# Patient Record
Sex: Male | Born: 1965
Health system: Southern US, Community
[De-identification: ages and names within clinical notes are randomized; demographics above are authoritative.]

## PROBLEM LIST (undated history)

## (undated) DIAGNOSIS — G4730 Sleep apnea, unspecified: Secondary | ICD-10-CM

## (undated) DIAGNOSIS — I10 Essential (primary) hypertension: Secondary | ICD-10-CM

## (undated) DIAGNOSIS — M109 Gout, unspecified: Secondary | ICD-10-CM

## (undated) DIAGNOSIS — K819 Cholecystitis, unspecified: Secondary | ICD-10-CM

## (undated) DIAGNOSIS — I509 Heart failure, unspecified: Secondary | ICD-10-CM

## (undated) DIAGNOSIS — K8020 Calculus of gallbladder without cholecystitis without obstruction: Secondary | ICD-10-CM

## (undated) DIAGNOSIS — R51 Headache: Secondary | ICD-10-CM

## (undated) DIAGNOSIS — J302 Other seasonal allergic rhinitis: Secondary | ICD-10-CM

## (undated) DIAGNOSIS — Z87442 Personal history of urinary calculi: Secondary | ICD-10-CM

## (undated) DIAGNOSIS — K5790 Diverticulosis of intestine, part unspecified, without perforation or abscess without bleeding: Secondary | ICD-10-CM

## (undated) DIAGNOSIS — R519 Headache, unspecified: Secondary | ICD-10-CM

## (undated) DIAGNOSIS — K219 Gastro-esophageal reflux disease without esophagitis: Secondary | ICD-10-CM

## (undated) DIAGNOSIS — T7840XA Allergy, unspecified, initial encounter: Secondary | ICD-10-CM

## (undated) HISTORY — DX: Calculus of gallbladder without cholecystitis without obstruction: K80.20

## (undated) HISTORY — DX: Gout, unspecified: M10.9

## (undated) HISTORY — PX: TONSILLECTOMY AND ADENOIDECTOMY: SUR1326

## (undated) HISTORY — DX: Cholecystitis, unspecified: K81.9

## (undated) HISTORY — DX: Diverticulosis of intestine, part unspecified, without perforation or abscess without bleeding: K57.90

## (undated) HISTORY — DX: Other seasonal allergic rhinitis: J30.2

## (undated) HISTORY — DX: Heart failure, unspecified: I50.9

## (undated) HISTORY — DX: Gastro-esophageal reflux disease without esophagitis: K21.9

## (undated) HISTORY — DX: Allergy, unspecified, initial encounter: T78.40XA

---
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
## 1999-06-24 ENCOUNTER — Emergency Department (HOSPITAL_COMMUNITY): Admission: EM | Admit: 1999-06-24 | Discharge: 1999-06-24 | Payer: Self-pay | Admitting: Emergency Medicine

## 2001-05-04 ENCOUNTER — Encounter: Admission: RE | Admit: 2001-05-04 | Discharge: 2001-08-02 | Payer: Self-pay | Admitting: Neurology

## 2001-05-31 ENCOUNTER — Ambulatory Visit (HOSPITAL_BASED_OUTPATIENT_CLINIC_OR_DEPARTMENT_OTHER): Admission: RE | Admit: 2001-05-31 | Discharge: 2001-05-31 | Payer: Self-pay | Admitting: Internal Medicine

## 2005-11-02 ENCOUNTER — Ambulatory Visit: Payer: Self-pay | Admitting: Internal Medicine

## 2013-09-26 ENCOUNTER — Other Ambulatory Visit: Payer: Self-pay | Admitting: Internal Medicine

## 2013-09-26 DIAGNOSIS — G43909 Migraine, unspecified, not intractable, without status migrainosus: Secondary | ICD-10-CM

## 2013-10-03 ENCOUNTER — Ambulatory Visit
Admission: RE | Admit: 2013-10-03 | Discharge: 2013-10-03 | Disposition: A | Discharge status: Home / Self Care | Payer: BC Managed Care – PPO | Source: Ambulatory Visit | Attending: Internal Medicine | Admitting: Internal Medicine

## 2013-10-03 DIAGNOSIS — G43909 Migraine, unspecified, not intractable, without status migrainosus: Secondary | ICD-10-CM

## 2013-10-03 MED ORDER — GADOBENATE DIMEGLUMINE 529 MG/ML IV SOLN
15.0000 mL | Freq: Once | INTRAVENOUS | Status: AC | PRN
  Administered 2013-10-03: 15 mL via INTRAVENOUS

## 2016-03-16 DIAGNOSIS — Z6828 Body mass index (BMI) 28.0-28.9, adult: Secondary | ICD-10-CM | POA: Diagnosis not present

## 2016-03-16 DIAGNOSIS — L03031 Cellulitis of right toe: Secondary | ICD-10-CM | POA: Diagnosis not present

## 2016-03-17 ENCOUNTER — Telehealth: Payer: Self-pay | Admitting: Podiatry

## 2016-03-17 NOTE — Telephone Encounter (Signed)
Spoke to Dr Raul Del assistant and she spoke to pt and pt feels appt not needed, toe is getting better per pt.Referral was shredded.

## 2016-03-17 NOTE — Telephone Encounter (Signed)
lvm for pt to call back to schedule an appt for this week with Dr Milinda Pointer per Dr Marton Redwood.

## 2016-03-18 ENCOUNTER — Encounter: Payer: Self-pay | Admitting: Podiatry

## 2016-03-18 ENCOUNTER — Ambulatory Visit (INDEPENDENT_AMBULATORY_CARE_PROVIDER_SITE_OTHER): Payer: BLUE CROSS/BLUE SHIELD

## 2016-03-18 ENCOUNTER — Ambulatory Visit (INDEPENDENT_AMBULATORY_CARE_PROVIDER_SITE_OTHER): Payer: BLUE CROSS/BLUE SHIELD | Admitting: Podiatry

## 2016-03-18 VITALS — BP 115/80 | HR 105 | Resp 16

## 2016-03-18 DIAGNOSIS — L02611 Cutaneous abscess of right foot: Secondary | ICD-10-CM | POA: Diagnosis not present

## 2016-03-18 DIAGNOSIS — L03031 Cellulitis of right toe: Secondary | ICD-10-CM | POA: Diagnosis not present

## 2016-03-18 LAB — CBC WITH DIFFERENTIAL/PLATELET
BASOS PCT: 0 %
Basophils Absolute: 0 cells/uL (ref 0–200)
EOS PCT: 1 %
Eosinophils Absolute: 85 cells/uL (ref 15–500)
HEMATOCRIT: 41.6 % (ref 38.5–50.0)
Hemoglobin: 14.3 g/dL (ref 13.2–17.1)
Lymphocytes Relative: 27 %
Lymphs Abs: 2295 cells/uL (ref 850–3900)
MCH: 32.1 pg (ref 27.0–33.0)
MCHC: 34.4 g/dL (ref 32.0–36.0)
MCV: 93.3 fL (ref 80.0–100.0)
MONO ABS: 595 {cells}/uL (ref 200–950)
MONOS PCT: 7 %
MPV: 9.7 fL (ref 7.5–12.5)
Neutro Abs: 5525 cells/uL (ref 1500–7800)
Neutrophils Relative %: 65 %
PLATELETS: 295 10*3/uL (ref 140–400)
RBC: 4.46 MIL/uL (ref 4.20–5.80)
RDW: 13.5 % (ref 11.0–15.0)
WBC: 8.5 10*3/uL (ref 3.8–10.8)

## 2016-03-18 LAB — URIC ACID: URIC ACID, SERUM: 6.3 mg/dL (ref 4.0–8.0)

## 2016-03-18 LAB — RHEUMATOID FACTOR

## 2016-03-18 LAB — C-REACTIVE PROTEIN: CRP: 0.8 mg/dL — ABNORMAL HIGH (ref <0.60)

## 2016-03-18 MED ORDER — COLCHICINE 0.6 MG PO TABS
0.6000 mg | ORAL_TABLET | Freq: Every day | ORAL | Status: DC
Start: 2016-03-18 — End: 2017-08-11

## 2016-03-18 NOTE — Progress Notes (Signed)
   Subjective:    Patient ID: Joseph Parrish, male    DOB: 1966/01/19, 50 y.o.   MRN: [id number]  HPI: He presents today with a chief complaint of a painful second digit to the right foot. He states that he had redness and swelling starting last Saturday which seem to worsen by Monday. He states that he denies any trauma. He saw his primary care provider on Monday who prescribed doxycycline. He is also been on 2 months of terbinafine for fungus. He relates no problems taking the antifungal or the antibiotics. He states that once he started taking the antibiotics the toe looked better for about 1 day and then become to the more painful and more swollen and more red. He relates that he has a family history of gout. He states he has never had his gout attack before.    Review of Systems  All other systems reviewed and are negative.      Objective:   Physical Exam: Vital signs are stable he is alert and oriented 3. Pulses are strongly palpable. Neurologic sensorium is intact deep tendon reflexes are intact muscle strength is 5 over 5 dorsiflexion plantar flexors and inverters everters on physical musculatures intact. Orthopedic evaluation does demonstrate a painful DIPJ second digit of the right foot today with considerable edema dark red tight brawny skin with erythema only around the dorsal aspect of the toe. There is no erythema plantarly. There is no pain plantarly. He does have pain on direct palpation of the DIPJ. Radiographs taken today lateral view in particular does demonstrate what appears to be a small dorsal lipping with possibly a small area of erosion to the proximal base of the distal phalanx. There are no open wounds or lesions.        Assessment & Plan:  Assessment: At this point I feel that this is either an insect bite or very localized gouty lesion.  Plan: We discussed the etiology pathology conservative versus surgical therapies. At this point I am requesting a CBC as well  as an arthritic profile. I also started him on colchicine and will continue his doxycycline. He will watch for signs and symptoms of worsening and notify there are any. Otherwise we'll see him back in a week or so.

## 2016-03-19 LAB — SEDIMENTATION RATE: Sed Rate: 5 mm/hr (ref 0–15)

## 2016-03-19 LAB — ANA: Anti Nuclear Antibody(ANA): NEGATIVE

## 2016-03-22 ENCOUNTER — Telehealth: Payer: Self-pay | Admitting: *Deleted

## 2016-03-22 NOTE — Telephone Encounter (Addendum)
-----   Message from Garrel Ridgel, [place] sent at 03/22/2016  7:12 AM EDT ----- Negative arthritis panel and fu as scheduled. Left message to call for results. Pt returned my call, but I had to leave a message informing pt of Dr. Stephenie Acres review of his labs and orders to follow up as scheduled.

## 2016-03-25 ENCOUNTER — Encounter: Payer: Self-pay | Admitting: Podiatry

## 2016-03-25 ENCOUNTER — Ambulatory Visit (INDEPENDENT_AMBULATORY_CARE_PROVIDER_SITE_OTHER): Payer: BLUE CROSS/BLUE SHIELD | Admitting: Podiatry

## 2016-03-25 DIAGNOSIS — M109 Gout, unspecified: Secondary | ICD-10-CM | POA: Diagnosis not present

## 2016-03-25 NOTE — Progress Notes (Signed)
He presents today for follow-up of his second digit right foot. He states that after taking the colchicine other than upset stomach the toe started to feel better. He states the redness is going down but is still slightly red around the tip of the toe and swollen. All are tender on palpation.  Objective: Pulses are palpable right. Much decrease and edema and erythema to the second digit of the right foot. DIPJ still bright red and upon flexion demonstrates 1 white spot to the dorsolateral aspect of the DIPJ consistent with gouty tophus.  Assessment: Gout second digit right foot.  Plan: We discussed the pros and cons of gout and the use of medications. I expressed to him that he should finish up his colchicine that he is currently taking is all tolerable. Should he develop any area that is painful or seems like another gout attack is to notify us or his primary doctor immediately so that we can get a uric acid level. At that point more than likely we will need to start him on allopurinol.

## 2016-09-27 HISTORY — PX: CHOLECYSTECTOMY: SHX55

## 2016-10-27 DIAGNOSIS — D2271 Melanocytic nevi of right lower limb, including hip: Secondary | ICD-10-CM | POA: Diagnosis not present

## 2016-10-27 DIAGNOSIS — L570 Actinic keratosis: Secondary | ICD-10-CM | POA: Diagnosis not present

## 2016-11-24 ENCOUNTER — Emergency Department (HOSPITAL_COMMUNITY)
Admission: EM | Admit: 2016-11-24 | Discharge: 2016-11-24 | Disposition: A | Discharge status: Home / Self Care | Payer: BLUE CROSS/BLUE SHIELD | Attending: Emergency Medicine | Admitting: Emergency Medicine

## 2016-11-24 ENCOUNTER — Encounter (HOSPITAL_COMMUNITY): Payer: Self-pay | Admitting: Emergency Medicine

## 2016-11-24 ENCOUNTER — Emergency Department (HOSPITAL_COMMUNITY): Payer: BLUE CROSS/BLUE SHIELD

## 2016-11-24 DIAGNOSIS — R1012 Left upper quadrant pain: Secondary | ICD-10-CM | POA: Diagnosis not present

## 2016-11-24 DIAGNOSIS — K8020 Calculus of gallbladder without cholecystitis without obstruction: Secondary | ICD-10-CM | POA: Diagnosis not present

## 2016-11-24 DIAGNOSIS — R945 Abnormal results of liver function studies: Secondary | ICD-10-CM | POA: Diagnosis not present

## 2016-11-24 DIAGNOSIS — F17200 Nicotine dependence, unspecified, uncomplicated: Secondary | ICD-10-CM | POA: Insufficient documentation

## 2016-11-24 DIAGNOSIS — R1111 Vomiting without nausea: Secondary | ICD-10-CM | POA: Diagnosis not present

## 2016-11-24 DIAGNOSIS — R7989 Other specified abnormal findings of blood chemistry: Secondary | ICD-10-CM

## 2016-11-24 HISTORY — DX: Essential (primary) hypertension: I10

## 2016-11-24 LAB — COMPREHENSIVE METABOLIC PANEL
ALBUMIN: 4.4 g/dL (ref 3.5–5.0)
ALK PHOS: 57 U/L (ref 38–126)
ALT: 71 U/L — AB (ref 17–63)
AST: 72 U/L — ABNORMAL HIGH (ref 15–41)
Anion gap: 11 (ref 5–15)
BILIRUBIN TOTAL: 0.7 mg/dL (ref 0.3–1.2)
BUN: 14 mg/dL (ref 6–20)
CALCIUM: 9.2 mg/dL (ref 8.9–10.3)
CO2: 28 mmol/L (ref 22–32)
Chloride: 104 mmol/L (ref 101–111)
Creatinine, Ser: 1.2 mg/dL (ref 0.61–1.24)
GFR calc non Af Amer: >60 mL/min (ref >60)
GLUCOSE: 137 mg/dL — AB (ref 65–99)
Potassium: 3.9 mmol/L (ref 3.5–5.1)
SODIUM: 143 mmol/L (ref 135–145)
Total Protein: 7.5 g/dL (ref 6.5–8.1)

## 2016-11-24 LAB — CBC
HCT: 41.9 % (ref 39.0–52.0)
Hemoglobin: 14.9 g/dL (ref 13.0–17.0)
MCH: 32.2 pg (ref 26.0–34.0)
MCHC: 35.6 g/dL (ref 30.0–36.0)
MCV: 90.5 fL (ref 78.0–100.0)
PLATELETS: 247 10*3/uL (ref 150–400)
RBC: 4.63 MIL/uL (ref 4.22–5.81)
RDW: 12.2 % (ref 11.5–15.5)
WBC: 7.3 10*3/uL (ref 4.0–10.5)

## 2016-11-24 LAB — URINALYSIS, ROUTINE W REFLEX MICROSCOPIC
BILIRUBIN URINE: NEGATIVE
Glucose, UA: NEGATIVE mg/dL
HGB URINE DIPSTICK: NEGATIVE
KETONES UR: 5 mg/dL — AB
Leukocytes, UA: NEGATIVE
Nitrite: NEGATIVE
Protein, ur: NEGATIVE mg/dL
Specific Gravity, Urine: 1.021 (ref 1.005–1.030)
pH: 7 (ref 5.0–8.0)

## 2016-11-24 LAB — LIPASE, BLOOD: Lipase: 40 U/L (ref 11–51)

## 2016-11-24 LAB — AMYLASE: AMYLASE: 86 U/L (ref 28–100)

## 2016-11-24 LAB — ETHANOL: Alcohol, Ethyl (B): <5 mg/dL (ref <5)

## 2016-11-24 MED ORDER — IOPAMIDOL (ISOVUE-300) INJECTION 61%
30.0000 mL | Freq: Once | INTRAVENOUS | Status: AC | PRN
  Administered 2016-11-24: 30 mL via ORAL

## 2016-11-24 MED ORDER — SODIUM CHLORIDE 0.9 % IV BOLUS (SEPSIS)
1000.0000 mL | Freq: Once | INTRAVENOUS | Status: AC
  Administered 2016-11-24: 1000 mL via INTRAVENOUS

## 2016-11-24 MED ORDER — MORPHINE SULFATE (PF) 4 MG/ML IV SOLN
4.0000 mg | Freq: Once | INTRAVENOUS | Status: AC
  Administered 2016-11-24: 4 mg via INTRAVENOUS
  Filled 2016-11-24: qty 1

## 2016-11-24 NOTE — ED Notes (Signed)
Patient given a gingerale

## 2016-11-24 NOTE — ED Triage Notes (Signed)
Pt presents to ED with c/o sudden onset of left sided abdominal pain since last night. Pt describes pain as severely sharp.

## 2016-11-24 NOTE — ED Provider Notes (Signed)
Patient care assumed from [facility], PA-C at shift change. Please see her note for further.  Briefly, the patient presented with left upper quadrant abdominal pain. His blood work is remarkable only for mildly elevated liver enzymes with an AST of 72 and ALT of 71. Plan at shift change was for CT abdomen and pelvis. Likely discharge if this is unremarkable.  CT abdomen and pelvis showed an appendix that was mildly prominent in size without surrounding inflammation. Exam patient has no lower abdominal tenderness to palpation. Doubt appendicitis. It also revealed the gallbladder wall thickened and slightly edematous. Advised correlation with ultrasound of the gallbladder to further assess.  It also revealed localized fluid between the pancreatic head and proximal duodenum. Could be pancreatitis versus duodenitis. Remainder the pancreas and duodenum appear normal. Radiology would advise correlation with amylase and lipase. There is no bowel obstruction. There is a 1 mm left renal calculi with no hydronephrosis.  Abdominal ultrasound was obtained which showed cholelithiasis with gallbladder wall thickening but no significant tenderness to palpation of his gallbladder. On my exam patient has no tenderness to his right upper quadrant or epigastric area. He is able to tolerate by mouth without nausea or vomiting. He tells me his pain is resolved. He is feeling much better. He has normal lipase and amylase.   We'll discharge at this time with follow-up by general surgery for his cholelithiasis. I discussed her condition specific return precautions with the patient. I advised he needs to return to the emergency room if he has fevers, new abdominal pain, uncontrollable vomiting or other new or worsening symptoms or new concerns. The patient agrees with plan. I also encouraged him to follow-up with primary care to have his liver function rechecked. Advised to discontinue alcohol use in this time. I advised the  patient to follow-up with their primary care provider this week. I advised the patient to return to the emergency department with new or worsening symptoms or new concerns. The patient verbalized understanding and agreement with plan.    This patient was discussed with Dr. Laneta Simmers who agrees with assessment and plan.   Results for orders placed or performed during the hospital encounter of 11/24/16  Lipase, blood  Result Value Ref Range   Lipase 40 11 - 51 U/L  Comprehensive metabolic panel  Result Value Ref Range   Sodium 143 135 - 145 mmol/L   Potassium 3.9 3.5 - 5.1 mmol/L   Chloride 104 101 - 111 mmol/L   CO2 28 22 - 32 mmol/L   Glucose, Bld 137 (H) 65 - 99 mg/dL   BUN 14 6 - 20 mg/dL   Creatinine, Ser 1.20 0.61 - 1.24 mg/dL   Calcium 9.2 8.9 - 10.3 mg/dL   Total Protein 7.5 6.5 - 8.1 g/dL   Albumin 4.4 3.5 - 5.0 g/dL   AST 72 (H) 15 - 41 U/L   ALT 71 (H) 17 - 63 U/L   Alkaline Phosphatase 57 38 - 126 U/L   Total Bilirubin 0.7 0.3 - 1.2 mg/dL   GFR calc non Af Amer >60 >60 mL/min   GFR calc Af Amer >60 >60 mL/min   Anion gap 11 5 - 15  CBC  Result Value Ref Range   WBC 7.3 4.0 - 10.5 K/uL   RBC 4.63 4.22 - 5.81 MIL/uL   Hemoglobin 14.9 13.0 - 17.0 g/dL   HCT 41.9 39.0 - 52.0 %   MCV 90.5 78.0 - 100.0 fL   MCH 32.2 26.0 -  34.0 pg   MCHC 35.6 30.0 - 36.0 g/dL   RDW 12.2 11.5 - 15.5 %   Platelets 247 150 - 400 K/uL  Urinalysis, Routine w reflex microscopic  Result Value Ref Range   Color, Urine YELLOW YELLOW   APPearance CLEAR CLEAR   Specific Gravity, Urine 1.021 1.005 - 1.030   pH 7.0 5.0 - 8.0   Glucose, UA NEGATIVE NEGATIVE mg/dL   Hgb urine dipstick NEGATIVE NEGATIVE   Bilirubin Urine NEGATIVE NEGATIVE   Ketones, ur 5 (A) NEGATIVE mg/dL   Protein, ur NEGATIVE NEGATIVE mg/dL   Nitrite NEGATIVE NEGATIVE   Leukocytes, UA NEGATIVE NEGATIVE  Amylase  Result Value Ref Range   Amylase 86 28 - 100 U/L  Ethanol  Result Value Ref Range   Alcohol, Ethyl (B) <5 <5  mg/dL   Ct Abdomen Pelvis Wo Contrast  Result Date: 11/24/2016 CLINICAL DATA:  Lower abdominal pain with nausea and vomiting EXAM: CT ABDOMEN AND PELVIS WITHOUT CONTRAST TECHNIQUE: Multidetector CT imaging of the abdomen and pelvis was performed following the standard protocol without IV contrast. Oral contrast was administered. COMPARISON:  None. FINDINGS: Lower chest: There is mild bibasilar atelectatic change. No lung base edema or consolidation. There is a fairly small hiatal hernia. Hepatobiliary: No focal liver lesions are evident on this noncontrast enhanced study. Gallbladder wall appears thickened and questionably slightly edematous on this study. There is no biliary duct dilatation. Pancreas: There is fluid tracking between the pancreatic head and proximal duodenum measuring 4.2 x 1.2 cm. Question of localized fluid from pancreatitis. Remainder the pancreas appears normal. No pancreatic mass or pancreatic duct dilatation. Spleen: No splenic lesions are evident. Adrenals/Urinary Tract: Adrenals appear unremarkable bilaterally. There is a cyst arising from the lateral mid right kidney measuring 3.1 x 2.6 cm. There is no hydronephrosis on either side. There is a 1 mm calculus in the upper pole left kidney. There is a 1 mm calculus in the mid left kidney. There is no appreciable ureteral calculus on either side. Urinary bladder is midline with wall thickness within normal limits. Stomach/Bowel: There are occasional sigmoid diverticula without diverticulitis. There is no bowel wall or mesenteric thickening. No bowel obstruction. No free air or portal venous air. As noted above, there is fluid tracking between the proximal duodenum and pancreatic head. Vascular/Lymphatic: There is no abdominal aortic aneurysm. No vascular lesions are evident. There is no adenopathy in the abdomen or pelvis. There are a few tiny lymph nodes in the right abdomen which do not meet size criteria for pathologic significance.  Reproductive: Prostate and seminal vesicles are normal in size and contour. There is no pelvic mass. Other: Appendix is mildly prominent measuring 9 mm in maximum diameter. There does not appear to be wall thickening of the appendix, and there is no peri appendiceal region inflammation. This significance of this mild prominence of the appendix is uncertain. There is no ascites or abscess in the abdomen or pelvis. There is a minimal ventral hernia containing only fat. Musculoskeletal: There are no blastic or lytic bone lesions. No intramuscular or abdominal wall lesion. IMPRESSION: Appendix is mildly prominent in size without surrounding inflammation or appreciable wall thickening. Significance of this finding is uncertain. It does warrant close clinical and appropriate laboratory surveillance. If symptoms localized to the right lower quadrant, it may be reasonable to consider repeat imaging with particular attention to the appendix. The gallbladder wall appears mildly thickened and questionably slightly edematous. Advise correlation with ultrasound of the gallbladder to  further assess. Localized fluid between the pancreatic head and proximal duodenum. Question a degree of acute pancreatitis versus duodenitis with fluid. Note that remainder of pancreas and duodenum appear normal. Advise correlation with serum amylase and lipase. It should be noted that cholecystitis could cause duodenitis an pancreatitis an account for the fluid seen in the right upper quadrant medial to the gallbladder. No bowel obstruction.  No abscess. A left renal calculi.  No hydronephrosis.  Ureteral calculi. Occasional sigmoid diverticula without diverticulitis. These results were called by telephone at the time of interpretation on 11/24/2016 at 8:39 am to [facility], [place], who verbally acknowledged these results. Electronically Signed   By: Lowella Grip III M.D.   On: 11/24/2016 08:39   US Abdomen Limited  Result Date:  11/24/2016 CLINICAL DATA:  Acute upper quadrant abdominal pain. Abnormal gallbladder on a current abdomen and pelvis CT scan. EXAM: US ABDOMEN LIMITED - RIGHT UPPER QUADRANT COMPARISON:  CT, 11/24/2016 at 8:11 a.m. FINDINGS: Gallbladder: Gallbladder is moderately distended. There are several stones, 1 in the gallbladder neck measuring 1.1 cm. Wall is thickened to 5.8 mm. There is no sonographic Murphy's sign, however. Common bile duct: Diameter: 3.6 mm Liver: No focal lesion identified. Within normal limits in parenchymal echogenicity. IMPRESSION: 1. Cholelithiasis with gallbladder wall thickening, but no significant tenderness to transducer pressure over the gallbladder. Findings support acute cholecystitis in the proper clinical setting. Electronically Signed   By: Lajean Manes M.D.   On: 11/24/2016 09:26    LUQ abdominal pain  Elevated LFTs  Gallstones     Waynetta Pean, PA-C 11/24/16 1057    Leo Grosser, MD 11/24/16 [phone]

## 2016-11-24 NOTE — ED Notes (Signed)
Patient is A & O x4.  He understood discharge instructions 

## 2016-11-24 NOTE — ED Provider Notes (Signed)
[facility] DEPT Provider Note   CSN: [id number] Arrival date & time: 11/24/16  0431     History   Chief Complaint Chief Complaint  Patient presents with  . Abdominal Pain    HPI Joseph Parrish is a 51 y.o. male with a PMHx of gout and HTN, who presents to the ED with complaints of sudden onset left upper quadrant abdominal pain that began at 1:30 AM approximately 4 hours prior to evaluation. He describes the pain as 8/10 constant waxing/waning sharp nonradiating left upper quadrant pain with no known aggravating factors, no treatments tried prior to arrival, and no known alleviating factors. He self-induced 4 episodes of nonbloody nonbilious emesis in an attempt to try to help his pain, although he denies that he ever felt nauseated. He admits that he had a glass of wine last night, states that he is a social alcohol drinker. He denies any recent travel, sick contacts, suspicious food intake, NSAID use, or prior abdominal surgeries. Denies any history of kidney stones. He denies fevers, chills, CP, SOB, nausea, diarrhea, constipation, obstipation, melena, hematochezia, hematemesis, testicular pain/swelling, penile discharge, urinary frequency, flank pain, hematuria, dysuria, myalgias, arthralgias, numbness, tingling, focal weakness, or any other complaints at this time.    The history is provided by the patient and medical records. No language interpreter was used.  Abdominal Pain   This is a new problem. The current episode started 3 to 5 hours ago. The problem occurs constantly. The problem has not changed since onset.The pain is associated with an unknown factor. The pain is located in the LUQ. The quality of the pain is sharp. The pain is at a severity of 8/10. The pain is moderate. Associated symptoms include vomiting (self-induced x4). Pertinent negatives include fever, diarrhea, flatus, hematochezia, melena, nausea, constipation, dysuria, frequency, hematuria, arthralgias and  myalgias. Nothing aggravates the symptoms. Nothing relieves the symptoms.    History reviewed. No pertinent past medical history.  There are no active problems to display for this patient.   History reviewed. No pertinent surgical history.     Home Medications    Prior to Admission medications   Medication Sig Start Date End Date Taking? Authorizing Provider  colchicine 0.6 MG tablet Take 1 tablet (0.6 mg total) by mouth daily. 03/18/16   Max T Hyatt, DPM  doxycycline (VIBRA-TABS) 100 MG tablet  03/16/16   Historical Provider, MD  lisinopril (PRINIVIL,ZESTRIL) 20 MG tablet Take 20 mg by mouth daily. 12/31/15   Historical Provider, MD  protriptyline (VIVACTIL) 10 MG tablet Take 10 mg by mouth at bedtime. 03/14/16   Historical Provider, MD  terbinafine (LAMISIL) 250 MG tablet  03/14/16   Historical Provider, MD    Family History History reviewed. No pertinent family history.  Social History Social History  Substance Use Topics  . Smoking status: Current Every Day Smoker  . Smokeless tobacco: Current User  . Alcohol use 0.0 oz/week     Allergies   Patient has no known allergies.   Review of Systems Review of Systems  Constitutional: Negative for chills and fever.  Respiratory: Negative for shortness of breath.   Cardiovascular: Negative for chest pain.  Gastrointestinal: Positive for abdominal pain and vomiting (self-induced x4). Negative for blood in stool, constipation, diarrhea, flatus, hematochezia, melena and nausea.  Genitourinary: Negative for discharge, dysuria, flank pain, frequency, hematuria, scrotal swelling and testicular pain.  Musculoskeletal: Negative for arthralgias and myalgias.  Skin: Negative for color change.  Allergic/Immunologic: Negative for immunocompromised state.  Neurological: Negative for  weakness and numbness.  Psychiatric/Behavioral: Negative for confusion.   10 Systems reviewed and are negative for acute change except as noted in the HPI.    Physical Exam Updated Vital Signs BP 147/95 (BP Location: Left Arm)   Pulse 93   Temp 97.4 F (36.3 C) (Oral)   Resp 20   Ht 5\' 8"  (1.727 m)   Wt 83.9 kg   SpO2 100%   BMI 28.13 kg/m   Physical Exam  Constitutional: He is oriented to person, place, and time. Vital signs are normal. He appears well-developed and well-nourished.  Non-toxic appearance. He appears distressed (in pain).  Afebrile, nontoxic, appears in pain, tearful at times, clutching his LUQ  HENT:  Head: Normocephalic and atraumatic.  Mouth/Throat: Oropharynx is clear and moist and mucous membranes are normal.  Eyes: Conjunctivae and EOM are normal. Right eye exhibits no discharge. Left eye exhibits no discharge.  Neck: Normal range of motion. Neck supple.  Cardiovascular: Normal rate, regular rhythm, normal heart sounds and intact distal pulses.  Exam reveals no gallop and no friction rub.   No murmur heard. Pulmonary/Chest: Effort normal and breath sounds normal. No respiratory distress. He has no decreased breath sounds. He has no wheezes. He has no rhonchi. He has no rales.  Abdominal: Soft. Normal appearance and bowel sounds are normal. He exhibits no distension. There is tenderness in the left upper quadrant. There is no rigidity, no rebound, no guarding, no CVA tenderness, no tenderness at McBurney's point and negative Murphy's sign.    Soft, nondistended, +BS throughout, with mild LUQ TTP near the lateral aspect of the abdomen, no r/g/r, neg murphy's, neg mcburney's, no CVA TTP   Musculoskeletal: Normal range of motion.  Neurological: He is alert and oriented to person, place, and time. He has normal strength. No sensory deficit.  Skin: Skin is warm, dry and intact. No rash noted.  Psychiatric: He has a normal mood and affect.  Nursing note and vitals reviewed.    ED Treatments / Results  Labs (all labs ordered are listed, but only abnormal results are displayed) Labs Reviewed  COMPREHENSIVE METABOLIC  PANEL - Abnormal; Notable for the following:       Result Value   Glucose, Bld 137 (*)    AST 72 (*)    ALT 71 (*)    All other components within normal limits  URINALYSIS, ROUTINE W REFLEX MICROSCOPIC - Abnormal; Notable for the following:    Ketones, ur 5 (*)    All other components within normal limits  LIPASE, BLOOD  CBC    EKG  EKG Interpretation None       Radiology No results found.  Procedures Procedures (including critical care time)  Medications Ordered in ED Medications  morphine 4 MG/ML injection 4 mg (4 mg Intravenous Given 11/24/16 0533)  sodium chloride 0.9 % bolus 1,000 mL (1,000 mLs Intravenous New Bag/Given 11/24/16 0515)  iopamidol (ISOVUE-300) 61 % injection 30 mL (30 mLs Oral Contrast Given 11/24/16 0620)     Initial Impression / Assessment and Plan / ED Course  I have reviewed the triage vital signs and the nursing notes.  Pertinent labs & imaging results that were available during my care of the patient were reviewed by me and considered in my medical decision making (see chart for details).     51 y.o. male here with sudden onset LUQ pain onset 4hrs PTA. Reports that he induced vomiting to try to help it, but denies that he feels  nauseated. On exam, pt tearful and appears to be in pain, mild LUQ TTP, nonperitoneal, no flank tenderness, neg murphy's/mcburney's. Could be kidney stone, vs other intraabdominal pathology, although not impressively tender. CBC WNL, other labs pending. Will proceed with obtaining CT with oral contrast but no IV contrast, in order to eval for kidney stones vs other intraabdominal pathology. Will give fluids and pain meds, then reassess shortly.   6:41 AM Pt much more comfortable, pain improving. CMP with mildly elevated AST/ALT 72/71 respectively, otherwise unremarkable. Lipase WNL. U/A unremarkable. CT pending. Patient care to be resumed by Will Dansie, PA-C at shift change sign-out. Patient history has been discussed with  midlevel resuming care. Please see their notes for further documentation of pending results and dispo/care. Pt stable at sign-out and updated on transfer of care.   Final Clinical Impressions(s) / ED Diagnoses   Final diagnoses:  LUQ abdominal pain  Elevated LFTs    New Prescriptions New Prescriptions   No medications on file     [address] PA-C 11/24/16 [place], MD 11/24/16 1615

## 2016-12-10 ENCOUNTER — Ambulatory Visit: Payer: Self-pay | Admitting: Surgery

## 2016-12-10 DIAGNOSIS — K8020 Calculus of gallbladder without cholecystitis without obstruction: Secondary | ICD-10-CM | POA: Diagnosis not present

## 2016-12-10 NOTE — H&P (Signed)
Joseph Parrish 12/10/2016 9:31 AM Location: [facility] Patient #: [id number] DOB: 1965-12-21 Divorced / Language: Cleophus Molt / Race: White Male  History of Present Illness Joseph Moores A. Elis Sauber MD; 12/10/2016 12:28 PM) Patient words: Patient sent at the request of Dr. Patricia Pesa for abdominal pain. Patient was seen 2 weeks ago and emergency room for left upper quadrant abdominal pain nausea and vomiting. He was found to have gallstones and a left 1 mm kidney stone. After that he's had 2 episodes of nausea and vomiting after eating fatty and greasy meals. He denies any severe abdominal pain or back pain. Denies flank pain or blood in his urine or stool.  Patient denies fever or chills. This is never happened to him before. Ultrasound showed gallstones and CT scan showed a small left renal calculi measuring 1 mm not causing obstruction.                CLINICAL DATA: Acute upper quadrant abdominal pain. Abnormal gallbladder on a current abdomen and pelvis CT scan.  EXAM: US ABDOMEN LIMITED - RIGHT UPPER QUADRANT  COMPARISON: CT, 11/24/2016 at 8:11 a.m.  FINDINGS: Gallbladder:  Gallbladder is moderately distended. There are several stones, 1 in the gallbladder neck measuring 1.1 cm. Wall is thickened to 5.8 mm. There is no sonographic Murphy's sign, however.  Common bile duct:  Diameter: 3.6 mm  Liver:  No focal lesion identified. Within normal limits in parenchymal echogenicity.  IMPRESSION: 1. Cholelithiasis with gallbladder wall thickening, but no significant tenderness to transducer pressure over the gallbladder. Findings support acute cholecystitis in the proper clinical setting.   Electronically Signed By: Lajean Manes M.D. On: 11/24/2016 09:26.  The patient is a 51 year old male.   Past Surgical History Nance Pear, [place]; 12/10/2016 9:31 AM) Tonsillectomy Vasectomy  Diagnostic Studies History Nance Pear, [place]; 12/10/2016  9:31 AM) Colonoscopy never  Allergies Bary Castilla [place], CMA; 12/10/2016 9:32 AM) No Known Drug Allergies 12/10/2016 Allergies Reconciled  Medication History Nance Pear, CMA; 12/10/2016 9:32 AM) Lisinopril (20MG  Tablet, Oral daily) Active. Protriptyline HCl (10MG  Tablet, Oral daily) Active. Medications Reconciled  Social History Nance Pear, [place]; 12/10/2016 9:31 AM) Alcohol use Moderate alcohol use. Caffeine use Coffee. No drug use Tobacco use Never smoker.  Family History Nance Pear, [place]; 12/10/2016 9:31 AM) Cancer Mother. Cerebrovascular Accident Father. Hypertension Mother. Melanoma Father. Migraine Headache Father.  Other Problems Nance Pear, [place]; 12/10/2016 9:31 AM) High blood pressure Kidney Stone Migraine Headache     Review of Systems Nance Pear CMA; 12/10/2016 9:31 AM) General Not Present- Appetite Loss, Chills, Fatigue, Fever, Night Sweats, Weight Gain and Weight Loss. Skin Not Present- Change in Wart/Mole, Dryness, Hives, Jaundice, New Lesions, Non-Healing Wounds, Rash and Ulcer. HEENT Present- Seasonal Allergies. Not Present- Earache, Hearing Loss, Hoarseness, Nose Bleed, Oral Ulcers, Ringing in the Ears, Sinus Pain, Sore Throat, Visual Disturbances, Wears glasses/contact lenses and Yellow Eyes. Respiratory Not Present- Bloody sputum, Chronic Cough, Difficulty Breathing, Snoring and Wheezing. Breast Not Present- Breast Mass, Breast Pain, Nipple Discharge and Skin Changes. Cardiovascular Not Present- Chest Pain, Difficulty Breathing Lying Down, Leg Cramps, Palpitations, Rapid Heart Rate, Shortness of Breath and Swelling of Extremities. Gastrointestinal Present- Vomiting. Not Present- Abdominal Pain, Bloating, Bloody Stool, Change in Bowel Habits, Chronic diarrhea, Constipation, Difficulty Swallowing, Excessive gas, Gets full quickly at meals, Hemorrhoids, Indigestion, Nausea and Rectal Pain. Male Genitourinary Not Present- Blood in  Urine, Change in Urinary Stream, Frequency, Impotence, Nocturia, Painful Urination, Urgency and Urine Leakage. Musculoskeletal Not Present- Back Pain, Joint Pain,  Joint Stiffness, Muscle Pain, Muscle Weakness and Swelling of Extremities. Neurological Not Present- Decreased Memory, Fainting, Headaches, Numbness, Seizures, Tingling, Tremor, Trouble walking and Weakness. Psychiatric Not Present- Anxiety, Bipolar, Change in Sleep Pattern, Depression, Fearful and Frequent crying. Endocrine Not Present- Cold Intolerance, Excessive Hunger, Hair Changes, Heat Intolerance, Hot flashes and New Diabetes. Hematology Not Present- Blood Thinners, Easy Bruising, Excessive bleeding, Gland problems, HIV and Persistent Infections.  Vitals Bary Castilla [place] CMA; 12/10/2016 9:33 AM) 12/10/2016 9:32 AM Weight: 182.2 lb Height: 68in Body Surface Area: 1.96 m Body Mass Index: 27.7 kg/m  Temp.: 98.36F  Pulse: 97 (Regular)  BP: 118/74 (Sitting, Left Arm, Standard)      Physical Exam (Mandy Fitzwater A. Kyira Volkert MD; 12/10/2016 12:28 PM)  General Mental Status-Alert. General Appearance-Consistent with stated age. Hydration-Well hydrated. Voice-Normal.  Head and Neck Head-normocephalic, atraumatic with no lesions or palpable masses.  Eye Eyeball - Bilateral-Extraocular movements intact. Sclera/Conjunctiva - Bilateral-No scleral icterus.  Chest and Lung Exam Chest and lung exam reveals -quiet, even and easy respiratory effort with no use of accessory muscles and on auscultation, normal breath sounds, no adventitious sounds and normal vocal resonance. Inspection Chest Wall - Normal. Back - normal.  Cardiovascular Cardiovascular examination reveals -on palpation PMI is normal in location and amplitude, no palpable S3 or S4. Normal cardiac borders., normal heart sounds, regular rate and rhythm with no murmurs, carotid auscultation reveals no bruits and normal pedal pulses  bilaterally.  Abdomen Inspection Inspection of the abdomen reveals - No Hernias. Skin - Scar - no surgical scars. Palpation/Percussion Palpation and Percussion of the abdomen reveal - Soft, Non Tender, No Rebound tenderness, No Rigidity (guarding) and No hepatosplenomegaly. Auscultation Auscultation of the abdomen reveals - Bowel sounds normal.  Neurologic Neurologic evaluation reveals -alert and oriented x 3 with no impairment of recent or remote memory. Mental Status-Normal.  Musculoskeletal Normal Exam - Left-Upper Extremity Strength Normal and Lower Extremity Strength Normal. Normal Exam - Right-Upper Extremity Strength Normal, Lower Extremity Weakness.    Assessment & Plan (Maahi Lannan A. Chizaram Latino MD; 12/10/2016 12:29 PM)  SYMPTOMATIC CHOLELITHIASIS (K80.20) Impression: This is probably early in its evolution. I offered observation to see how he does versus cholecystectomy. He will think about it and let me know really leaning toward laparoscopic cholecystectomy and cholangiogram. The procedure has been discussed with the patient. Risks of laparoscopic cholecystectomy include bleeding, infection, bile duct injury, leak, death, open surgery, diarrhea, other surgery, organ injury, blood vessel injury, DVT, and additional care.  Current Plans You are being scheduled for surgery- Our schedulers will call you.  You should hear from our office's scheduling department within 5 working days about the location, date, and time of surgery. We try to make accommodations for patient's preferences in scheduling surgery, but sometimes the OR schedule or the surgeon's schedule prevents [place] from making those accommodations.  If you have not heard from our office [phone]) in 5 working days, call the office and ask for your surgeon's nurse.  If you have other questions about your diagnosis, plan, or surgery, call the office and ask for your surgeon's nurse.  Pt Education - Pamphlet Given  - Laparoscopic Gallbladder Surgery: discussed with patient and provided information. The anatomy & physiology of hepatobiliary & pancreatic function was discussed. The pathophysiology of gallbladder dysfunction was discussed. Natural history risks without surgery was discussed. I feel the risks of no intervention will lead to serious problems that outweigh the operative risks; therefore, I recommended cholecystectomy to remove the pathology. I explained laparoscopic techniques  with possible need for an open approach. Probable cholangiogram to evaluate the bilary tract was explained as well.  Risks such as bleeding, infection, abscess, leak, injury to other organs, need for further treatment, heart attack, death, and other risks were discussed. I noted a good likelihood this will help address the problem. Possibility that this will not correct all abdominal symptoms was explained. Goals of post-operative recovery were discussed as well. We will work to minimize complications. An educational handout further explaining the pathology and treatment options was given as well. Questions were answered. The patient expresses understanding & wishes to proceed with surgery.  Pt Education - CCS Laparosopic Post Op HCI (Gross) Pt Education - Laparoscopic Cholecystectomy: gallbladder

## 2016-12-16 DIAGNOSIS — Z0000 Encounter for general adult medical examination without abnormal findings: Secondary | ICD-10-CM | POA: Diagnosis not present

## 2016-12-16 DIAGNOSIS — R7301 Impaired fasting glucose: Secondary | ICD-10-CM | POA: Diagnosis not present

## 2016-12-16 DIAGNOSIS — Z125 Encounter for screening for malignant neoplasm of prostate: Secondary | ICD-10-CM | POA: Diagnosis not present

## 2016-12-16 DIAGNOSIS — I10 Essential (primary) hypertension: Secondary | ICD-10-CM | POA: Diagnosis not present

## 2016-12-22 DIAGNOSIS — E784 Other hyperlipidemia: Secondary | ICD-10-CM | POA: Diagnosis not present

## 2016-12-22 DIAGNOSIS — I10 Essential (primary) hypertension: Secondary | ICD-10-CM | POA: Diagnosis not present

## 2016-12-22 DIAGNOSIS — E781 Pure hyperglyceridemia: Secondary | ICD-10-CM | POA: Diagnosis not present

## 2016-12-22 DIAGNOSIS — R7301 Impaired fasting glucose: Secondary | ICD-10-CM | POA: Diagnosis not present

## 2016-12-22 DIAGNOSIS — Z1389 Encounter for screening for other disorder: Secondary | ICD-10-CM | POA: Diagnosis not present

## 2016-12-22 DIAGNOSIS — Z0000 Encounter for general adult medical examination without abnormal findings: Secondary | ICD-10-CM | POA: Diagnosis not present

## 2017-01-25 HISTORY — PX: CHOLECYSTECTOMY: SHX55

## 2017-02-01 ENCOUNTER — Encounter: Payer: Self-pay | Admitting: Internal Medicine

## 2017-02-15 DIAGNOSIS — K8010 Calculus of gallbladder with chronic cholecystitis without obstruction: Secondary | ICD-10-CM | POA: Diagnosis not present

## 2017-02-18 ENCOUNTER — Other Ambulatory Visit: Payer: Self-pay | Admitting: Internal Medicine

## 2017-02-18 DIAGNOSIS — Z6828 Body mass index (BMI) 28.0-28.9, adult: Secondary | ICD-10-CM | POA: Diagnosis not present

## 2017-02-18 DIAGNOSIS — I447 Left bundle-branch block, unspecified: Secondary | ICD-10-CM | POA: Diagnosis not present

## 2017-02-18 DIAGNOSIS — I10 Essential (primary) hypertension: Secondary | ICD-10-CM | POA: Diagnosis not present

## 2017-03-02 ENCOUNTER — Encounter (INDEPENDENT_AMBULATORY_CARE_PROVIDER_SITE_OTHER): Payer: Self-pay

## 2017-03-02 ENCOUNTER — Other Ambulatory Visit: Payer: Self-pay

## 2017-03-02 ENCOUNTER — Ambulatory Visit (HOSPITAL_COMMUNITY): Payer: BLUE CROSS/BLUE SHIELD | Attending: Cardiology

## 2017-03-02 DIAGNOSIS — I119 Hypertensive heart disease without heart failure: Secondary | ICD-10-CM | POA: Diagnosis not present

## 2017-03-02 DIAGNOSIS — I447 Left bundle-branch block, unspecified: Secondary | ICD-10-CM | POA: Insufficient documentation

## 2017-04-05 ENCOUNTER — Ambulatory Visit (HOSPITAL_COMMUNITY)
Admission: RE | Admit: 2017-04-05 | Discharge: 2017-04-05 | Disposition: A | Discharge status: Home / Self Care | Payer: BLUE CROSS/BLUE SHIELD | Source: Ambulatory Visit | Attending: Internal Medicine | Admitting: Internal Medicine

## 2017-04-05 ENCOUNTER — Encounter (HOSPITAL_COMMUNITY): Payer: Self-pay | Admitting: Internal Medicine

## 2017-04-05 ENCOUNTER — Encounter (HOSPITAL_COMMUNITY): Payer: Self-pay | Admitting: *Deleted

## 2017-04-05 ENCOUNTER — Other Ambulatory Visit (HOSPITAL_COMMUNITY): Payer: Self-pay | Admitting: *Deleted

## 2017-04-05 VITALS — BP 126/84 | HR 85 | Wt 184.8 lb

## 2017-04-05 DIAGNOSIS — E785 Hyperlipidemia, unspecified: Secondary | ICD-10-CM | POA: Diagnosis not present

## 2017-04-05 DIAGNOSIS — F17200 Nicotine dependence, unspecified, uncomplicated: Secondary | ICD-10-CM | POA: Diagnosis not present

## 2017-04-05 DIAGNOSIS — I447 Left bundle-branch block, unspecified: Secondary | ICD-10-CM

## 2017-04-05 DIAGNOSIS — I5022 Chronic systolic (congestive) heart failure: Secondary | ICD-10-CM | POA: Insufficient documentation

## 2017-04-05 DIAGNOSIS — M109 Gout, unspecified: Secondary | ICD-10-CM | POA: Insufficient documentation

## 2017-04-05 DIAGNOSIS — I429 Cardiomyopathy, unspecified: Secondary | ICD-10-CM

## 2017-04-05 DIAGNOSIS — I110 Hypertensive heart disease with heart failure: Secondary | ICD-10-CM | POA: Diagnosis not present

## 2017-04-05 LAB — CBC
HCT: 46.3 % (ref 39.0–52.0)
Hemoglobin: 15.8 g/dL (ref 13.0–17.0)
MCH: 31.7 pg (ref 26.0–34.0)
MCHC: 34.1 g/dL (ref 30.0–36.0)
MCV: 92.8 fL (ref 78.0–100.0)
PLATELETS: 263 10*3/uL (ref 150–400)
RBC: 4.99 MIL/uL (ref 4.22–5.81)
RDW: 13.3 % (ref 11.5–15.5)
WBC: 6.9 10*3/uL (ref 4.0–10.5)

## 2017-04-05 LAB — BASIC METABOLIC PANEL
Anion gap: 6 (ref 5–15)
BUN: 14 mg/dL (ref 6–20)
CALCIUM: 9.4 mg/dL (ref 8.9–10.3)
CO2: 27 mmol/L (ref 22–32)
CREATININE: 1.03 mg/dL (ref 0.61–1.24)
Chloride: 105 mmol/L (ref 101–111)
Glucose, Bld: 88 mg/dL (ref 65–99)
Potassium: 4.4 mmol/L (ref 3.5–5.1)
Sodium: 138 mmol/L (ref 135–145)

## 2017-04-05 MED ORDER — LOSARTAN POTASSIUM 50 MG PO TABS: 50.0000 mg | ORAL_TABLET | Freq: Every day | ORAL | 3 refills | Status: DC

## 2017-04-05 MED ORDER — CARVEDILOL 3.125 MG PO TABS: 3.1250 mg | ORAL_TABLET | Freq: Two times a day (BID) | ORAL | 3 refills | Status: DC

## 2017-04-05 NOTE — Progress Notes (Signed)
    ReDS Vest - 04/05/17 1100      ReDS Vest   MR  No   Estimated volume prior to reading Low   Fitting Posture Standing   Height Marker Tall   Ruler Value [place]   ReDS Value 26

## 2017-04-05 NOTE — Progress Notes (Signed)
Advanced [facility] Failure Clinic Consult Note   Referring Physician: Dr. Brigitte Pulse Primary Care: Dr. Brigitte Pulse Primary Cardiologist: New to Dr. Haroldine Laws   HPI: Joseph Parrish is a 51 year old male with a past medical history of HTN, gout. Newly diagnosed with systolic CHF in June 6270 with EF 25%. He is referred by Dr. Brigitte Pulse for further management of HF.    He was seen in the ED in 10/2016 with abdominal pain, found to have acute cholecystitis. S/p lap chole.   He was seen in Dr. Raul Del office for regular follow up and noted to have a LBBB on EKG. He was then referred for an Echo. EF was reduced at 25% and referred to Dr. Haroldine Laws for evaluation.  He presents today to establish care with Dr. Haroldine Laws. He is an active person, works out 3 times a week, runs and does boot camp. No SOB. Denies orthopnea and PND. He does snore, had a sleep study in 2000, was told he has borderline sleep apnea but it was not recommended that he have a CPAP. His father died suddenly at 20 with a brain aneurysm. His paternal grandfather had multiple CVA's. No family history of ischemic heart disease. He drinks about 2-3 beers about 3-4 times a week. Does not smoke. Denies ever having any chest pain. Does feel an occasional flutter in his heart, but is transient.  He has occasional bilateral pedal edema that gets better with elevation.     Review of Systems: [y] = yes, [ ]  = no   General: Weight gain [ ] ; Weight loss [ ] ; Anorexia [ ] ; Fatigue [ ] ; Fever [ ] ; Chills [ ] ; Weakness [ ]   Cardiac: Chest pain/pressure [ ] ; Resting SOB [ ] ; Exertional SOB [ ] ; Orthopnea [ ] ; Pedal Edema Blue.Reese ]; Palpitations [ ] ; Syncope [ ] ; Presyncope [ ] ; Paroxysmal nocturnal dyspnea[ ]   Pulmonary: Cough [ ] ; Wheezing[ ] ; Hemoptysis[ ] ; Sputum [ ] ; Snoring [ ]   GI: Vomiting[ ] ; Dysphagia[ ] ; Melena[ ] ; Hematochezia [ ] ; Heartburn[ ] ; Abdominal pain [ ] ; Constipation [ ] ; Diarrhea [ ] ; BRBPR [ ]   GU: Hematuria[ ] ; Dysuria [ ] ; Nocturia[ ]   Vascular:  Pain in legs with walking [ ] ; Pain in feet with lying flat [ ] ; Non-healing sores [ ] ; Stroke [ ] ; TIA [ ] ; Slurred speech [ ] ;  Neuro: Headaches[ ] ; Vertigo[ ] ; Seizures[ ] ; Paresthesias[ ] ;Blurred vision [ ] ; Diplopia [ ] ; Vision changes [ ]   Ortho/Skin: Arthritis [ ] ; Joint pain [ ] ; Muscle pain [ ] ; Joint swelling [ ] ; Back Pain [ ] ; Rash [ ]   Psych: Depression[ ] ; Anxiety[ ]   Heme: Bleeding problems [ ] ; Clotting disorders [ ] ; Anemia [ ]   Endocrine: Diabetes [ ] ; Thyroid dysfunction[ ]    Past Medical History:  Diagnosis Date  . Hypertension     Current Outpatient Prescriptions  Medication Sig Dispense Refill  . lisinopril (PRINIVIL,ZESTRIL) 20 MG tablet Take 20 mg by mouth daily.  3  . loratadine (CLARITIN) 10 MG tablet Take 10 mg by mouth daily.    . protriptyline (VIVACTIL) 10 MG tablet Take 10 mg by mouth at bedtime.  3  . colchicine 0.6 MG tablet Take 1 tablet (0.6 mg total) by mouth daily. (Patient not taking: Reported on 11/24/2016) 30 tablet 1   No current facility-administered medications for this encounter.     No Known Allergies    Social History   Social History  . Marital status: Married  Spouse name: N/A  . Number of children: N/A  . Years of education: N/A   Occupational History  . Not on file.   Social History Main Topics  . Smoking status: Current Every Day Smoker  . Smokeless tobacco: Current User  . Alcohol use 0.0 oz/week  . Drug use: Unknown  . Sexual activity: Not on file   Other Topics Concern  . Not on file   Social History Narrative  . No narrative on file      Family History  Problem Relation Age of Onset  . Aneurysm Father 61  . Stroke Paternal Grandfather     Vitals:   04/05/17 1133  BP: 126/84  Pulse: 85  SpO2: 100%  Weight: 184 lb 12 oz (83.8 kg)     PHYSICAL EXAM: General:  Well appearing. No respiratory difficulty HEENT: normal Neck: supple. no JVD. Carotids 2+ bilat; no bruits. No lymphadenopathy or  thyromegaly appreciated. Cor: PMI nondisplaced. Regular rate & rhythm. No rubs, gallops or murmurs. S2 widely split Lungs: clear bilaterally. Normal effort.  Abdomen: soft, nontender, nondistended. No hepatosplenomegaly. No bruits or masses. Good bowel sounds. Extremities: no cyanosis, clubbing, rash, edema Neuro: alert & oriented x 3, cranial nerves grossly intact. moves all 4 extremities w/o difficulty. Affect pleasant.  ECG: NSR, LBBB    ASSESSMENT & PLAN: 1. Chronic systolic CHF: Newly diagnosed, NICM due to LBBB . Echo reviewed by Dr. Haroldine Laws today, EF appears to be closer to 40%, previously read as 25%.  - NYHA I - Volume status stable on exam. Reds vest 26% - Will need LHC to rule out ischemic cause, plan for Friday of this week.  - Will also order repeat sleep study.  - Stop lisinopril - Start losartan 50mg  daily.  - Start Coreg 3.125 mg BID.  - Also will order cardiac MRI to further assess for scar, EF.   2. LBBB - Likely the cause of his cardiomyopathy.   3. HLD - LDL 136. Follows with Dr. Brigitte Pulse.   Follow up in 2 months. Will get all pre cath labs.   Joseph Leas, NP 04/05/17   Patient seen and examined with Joseph Booze, NP. We discussed all aspects of the encounter. I agree with the assessment and plan as stated above.   Echo reviewed personally and discussed with Dr. Johnsie Cancel. EF closer to 40%. He is asymptomatic NYHA I. Volume status looks good. Suspect this is primarily and electrical issue but will need to rule out other causes. Will plan coronary angiography this week followed by cMRI to rule out infiltrative process. Start losartan. May need sleep study.   We discussed possibility of CRT if EF should deteriorate down the road.   Joseph Bickers, MD  9:09 PM

## 2017-04-05 NOTE — Patient Instructions (Signed)
Stop Lisinopril  Start Losartan 50 mg daily  Start Carvedilol 3.125 mg Twice daily   Your physician has requested that you have a cardiac MRI. Cardiac MRI uses a computer to create images of your heart as its beating, producing both still and moving pictures of your heart and major blood vessels. For further information please visit [email or web address]-[email or web address] Please follow the instruction sheet given to you today for more information.  Your physician has recommended that you have a sleep study. This test records several body functions during sleep, including: brain activity, eye movement, oxygen and carbon dioxide blood levels, heart rate and rhythm, breathing rate and rhythm, the flow of air through your mouth and nose, snoring, body muscle movements, and chest and belly movement.  Your physician has requested that you have a cardiac catheterization. Cardiac catheterization is used to diagnose and/or treat various heart conditions. Doctors may recommend this procedure for a number of different reasons. The most common reason is to evaluate chest pain. Chest pain can be a symptom of coronary artery disease (CAD), and cardiac catheterization can show whether plaque is narrowing or blocking your heart's arteries. This procedure is also used to evaluate the valves, as well as measure the blood flow and oxygen levels in different parts of your heart. For further information please visit HugeFiesta.[email or web address]. Please follow instruction sheet, as given.  Your physician recommends that you schedule a follow-up appointment in: 2 months

## 2017-04-05 NOTE — Addendum Note (Signed)
Addended by: Scarlette Calico on: 04/05/2017 03:52 PM   Modules accepted: Orders

## 2017-04-08 ENCOUNTER — Ambulatory Visit (HOSPITAL_COMMUNITY)
Admission: RE | Admit: 2017-04-08 | Discharge: 2017-04-08 | Disposition: A | Discharge status: Home / Self Care | Payer: BLUE CROSS/BLUE SHIELD | Source: Ambulatory Visit | Attending: Internal Medicine | Admitting: Internal Medicine

## 2017-04-08 ENCOUNTER — Encounter (HOSPITAL_COMMUNITY): Payer: Self-pay | Admitting: Internal Medicine

## 2017-04-08 ENCOUNTER — Encounter (HOSPITAL_COMMUNITY)
Admission: RE | Disposition: A | Discharge status: Home / Self Care | Payer: Self-pay | Source: Ambulatory Visit | Attending: Internal Medicine

## 2017-04-08 DIAGNOSIS — I5022 Chronic systolic (congestive) heart failure: Secondary | ICD-10-CM | POA: Diagnosis not present

## 2017-04-08 DIAGNOSIS — E785 Hyperlipidemia, unspecified: Secondary | ICD-10-CM | POA: Diagnosis not present

## 2017-04-08 DIAGNOSIS — I447 Left bundle-branch block, unspecified: Secondary | ICD-10-CM | POA: Insufficient documentation

## 2017-04-08 DIAGNOSIS — I428 Other cardiomyopathies: Secondary | ICD-10-CM | POA: Insufficient documentation

## 2017-04-08 DIAGNOSIS — F17210 Nicotine dependence, cigarettes, uncomplicated: Secondary | ICD-10-CM | POA: Insufficient documentation

## 2017-04-08 DIAGNOSIS — M109 Gout, unspecified: Secondary | ICD-10-CM | POA: Diagnosis not present

## 2017-04-08 DIAGNOSIS — I110 Hypertensive heart disease with heart failure: Secondary | ICD-10-CM | POA: Diagnosis not present

## 2017-04-08 DIAGNOSIS — R931 Abnormal findings on diagnostic imaging of heart and coronary circulation: Secondary | ICD-10-CM

## 2017-04-08 DIAGNOSIS — I429 Cardiomyopathy, unspecified: Secondary | ICD-10-CM

## 2017-04-08 HISTORY — PX: LEFT HEART CATH AND CORONARY ANGIOGRAPHY: CATH118249

## 2017-04-08 LAB — PROTIME-INR
INR: 0.98
PROTHROMBIN TIME: 13 s (ref 11.4–15.2)

## 2017-04-08 LAB — CARDIAC CATHETERIZATION: CATHEFQUANT: 40 %

## 2017-04-08 SURGERY — LEFT HEART CATH AND CORONARY ANGIOGRAPHY
Anesthesia: LOCAL

## 2017-04-08 MED ORDER — MIDAZOLAM HCL 2 MG/2ML IJ SOLN
INTRAMUSCULAR | Status: DC | PRN
  Administered 2017-04-08: 1 mg via INTRAVENOUS

## 2017-04-08 MED ORDER — HEPARIN SODIUM (PORCINE) 1000 UNIT/ML IJ SOLN
INTRAMUSCULAR | Status: AC
  Filled 2017-04-08: qty 1

## 2017-04-08 MED ORDER — IOPAMIDOL (ISOVUE-370) INJECTION 76%
INTRAVENOUS | Status: AC
  Filled 2017-04-08: qty 100

## 2017-04-08 MED ORDER — SODIUM CHLORIDE 0.9 % IV SOLN: INTRAVENOUS | Status: DC

## 2017-04-08 MED ORDER — FENTANYL CITRATE (PF) 100 MCG/2ML IJ SOLN
INTRAMUSCULAR | Status: DC | PRN
  Administered 2017-04-08: 25 ug via INTRAVENOUS

## 2017-04-08 MED ORDER — ACETAMINOPHEN 325 MG PO TABS: 650.0000 mg | ORAL_TABLET | ORAL | Status: DC | PRN

## 2017-04-08 MED ORDER — SODIUM CHLORIDE 0.9 % IV SOLN
INTRAVENOUS | Status: DC
  Administered 2017-04-08: 07:00:00 via INTRAVENOUS

## 2017-04-08 MED ORDER — SODIUM CHLORIDE 0.9 % IV SOLN: 250.0000 mL | INTRAVENOUS | Status: DC | PRN

## 2017-04-08 MED ORDER — SODIUM CHLORIDE 0.9% FLUSH: 3.0000 mL | Freq: Two times a day (BID) | INTRAVENOUS | Status: DC

## 2017-04-08 MED ORDER — SODIUM CHLORIDE 0.9% FLUSH: 3.0000 mL | INTRAVENOUS | Status: DC | PRN

## 2017-04-08 MED ORDER — HEPARIN (PORCINE) IN NACL 2-0.9 UNIT/ML-% IJ SOLN
INTRAMUSCULAR | Status: AC | PRN
  Administered 2017-04-08: 1000 mL via INTRA_ARTERIAL

## 2017-04-08 MED ORDER — IOPAMIDOL (ISOVUE-370) INJECTION 76%
INTRAVENOUS | Status: DC | PRN
  Administered 2017-04-08: 60 mL via INTRA_ARTERIAL

## 2017-04-08 MED ORDER — VERAPAMIL HCL 2.5 MG/ML IV SOLN
INTRAVENOUS | Status: AC
  Filled 2017-04-08: qty 2

## 2017-04-08 MED ORDER — ASPIRIN 81 MG PO CHEW
81.0000 mg | CHEWABLE_TABLET | ORAL | Status: AC
  Administered 2017-04-08: 81 mg via ORAL

## 2017-04-08 MED ORDER — LIDOCAINE HCL (PF) 1 % IJ SOLN
INTRAMUSCULAR | Status: AC
  Filled 2017-04-08: qty 30

## 2017-04-08 MED ORDER — HEPARIN SODIUM (PORCINE) 1000 UNIT/ML IJ SOLN
INTRAMUSCULAR | Status: DC | PRN
  Administered 2017-04-08: 4000 [IU] via INTRAVENOUS

## 2017-04-08 MED ORDER — VERAPAMIL HCL 2.5 MG/ML IV SOLN
INTRAVENOUS | Status: DC | PRN
  Administered 2017-04-08: 08:00:00 via INTRA_ARTERIAL

## 2017-04-08 MED ORDER — MIDAZOLAM HCL 2 MG/2ML IJ SOLN
INTRAMUSCULAR | Status: AC
  Filled 2017-04-08: qty 2

## 2017-04-08 MED ORDER — ASPIRIN 81 MG PO CHEW
CHEWABLE_TABLET | ORAL | Status: AC
  Administered 2017-04-08: 81 mg via ORAL
  Filled 2017-04-08: qty 1

## 2017-04-08 MED ORDER — FENTANYL CITRATE (PF) 100 MCG/2ML IJ SOLN
INTRAMUSCULAR | Status: AC
  Filled 2017-04-08: qty 2

## 2017-04-08 MED ORDER — LIDOCAINE HCL (PF) 1 % IJ SOLN
INTRAMUSCULAR | Status: DC | PRN
  Administered 2017-04-08: 2 mL

## 2017-04-08 MED ORDER — ONDANSETRON HCL 4 MG/2ML IJ SOLN: 4.0000 mg | Freq: Four times a day (QID) | INTRAMUSCULAR | Status: DC | PRN

## 2017-04-08 MED ORDER — HEPARIN (PORCINE) IN NACL 2-0.9 UNIT/ML-% IJ SOLN
INTRAMUSCULAR | Status: AC
  Filled 2017-04-08: qty 1000

## 2017-04-08 SURGICAL SUPPLY — 11 items
CATH 5FR JL3.5 JR4 ANG PIG MP (CATHETERS) ×1 IMPLANT
DEVICE RAD COMP TR BAND LRG (VASCULAR PRODUCTS) ×2 IMPLANT
GLIDESHEATH SLEND SS 6F .021 (SHEATH) ×2 IMPLANT
GUIDEWIRE INQWIRE 1.5J.035X260 (WIRE) IMPLANT
INQWIRE 1.5J .035X260CM (WIRE) ×2
KIT HEART LEFT (KITS) ×2 IMPLANT
PACK CARDIAC CATHETERIZATION (CUSTOM PROCEDURE TRAY) ×2 IMPLANT
SYR MEDRAD MARK V 150ML (SYRINGE) ×2 IMPLANT
TRANSDUCER W/STOPCOCK (MISCELLANEOUS) ×2 IMPLANT
TUBING CIL FLEX 10 FLL-RA (TUBING) ×2 IMPLANT
WIRE HI TORQ VERSACORE-J 145CM (WIRE) ×1 IMPLANT

## 2017-04-08 NOTE — Discharge Instructions (Signed)

## 2017-04-08 NOTE — H&P (View-Only) (Signed)
[facility] Consult Note   Referring Physician: Dr. Brigitte Pulse Primary Care: Dr. Brigitte Pulse Primary Cardiologist: New to Dr. Haroldine Laws   HPI: Joseph Parrish is a 51 year old male with a past medical history of HTN, gout. Newly diagnosed with systolic CHF in June 2482 with EF 25%. He is referred by Dr. Brigitte Pulse for further management of HF.    He was seen in the ED in 10/2016 with abdominal pain, found to have acute cholecystitis. S/p lap chole.   He was seen in Dr. Raul Del office for regular follow up and noted to have a LBBB on EKG. He was then referred for an Echo. EF was reduced at 25% and referred to Dr. Haroldine Laws for evaluation.  He presents today to establish care with Dr. Haroldine Laws. He is an active person, works out 3 times a week, runs and does boot camp. No SOB. Denies orthopnea and PND. He does snore, had a sleep study in 2000, was told he has borderline sleep apnea but it was not recommended that he have a CPAP. His father died suddenly at 39 with a brain aneurysm. His paternal grandfather had multiple CVA's. No family history of ischemic heart disease. He drinks about 2-3 beers about 3-4 times a week. Does not smoke. Denies ever having any chest pain. Does feel an occasional flutter in his heart, but is transient.  He has occasional bilateral pedal edema that gets better with elevation.     Review of Systems: [y] = yes, [ ]  = no   General: Weight gain [ ] ; Weight loss [ ] ; Anorexia [ ] ; Fatigue [ ] ; Fever [ ] ; Chills [ ] ; Weakness [ ]   Cardiac: Chest pain/pressure [ ] ; Resting SOB [ ] ; Exertional SOB [ ] ; Orthopnea [ ] ; Pedal Edema Blue.Reese ]; Palpitations [ ] ; Syncope [ ] ; Presyncope [ ] ; Paroxysmal nocturnal dyspnea[ ]   Pulmonary: Cough [ ] ; Wheezing[ ] ; Hemoptysis[ ] ; Sputum [ ] ; Snoring [ ]   GI: Vomiting[ ] ; Dysphagia[ ] ; Melena[ ] ; Hematochezia [ ] ; Heartburn[ ] ; Abdominal pain [ ] ; Constipation [ ] ; Diarrhea [ ] ; BRBPR [ ]   GU: Hematuria[ ] ; Dysuria [ ] ; Nocturia[ ]   Vascular:  Pain in legs with walking [ ] ; Pain in feet with lying flat [ ] ; Non-healing sores [ ] ; Stroke [ ] ; TIA [ ] ; Slurred speech [ ] ;  Neuro: Headaches[ ] ; Vertigo[ ] ; Seizures[ ] ; Paresthesias[ ] ;Blurred vision [ ] ; Diplopia [ ] ; Vision changes [ ]   Ortho/Skin: Arthritis [ ] ; Joint pain [ ] ; Muscle pain [ ] ; Joint swelling [ ] ; Back Pain [ ] ; Rash [ ]   Psych: Depression[ ] ; Anxiety[ ]   Heme: Bleeding problems [ ] ; Clotting disorders [ ] ; Anemia [ ]   Endocrine: Diabetes [ ] ; Thyroid dysfunction[ ]    Past Medical History:  Diagnosis Date  . Hypertension     Current Outpatient Prescriptions  Medication Sig Dispense Refill  . lisinopril (PRINIVIL,ZESTRIL) 20 MG tablet Take 20 mg by mouth daily.  3  . loratadine (CLARITIN) 10 MG tablet Take 10 mg by mouth daily.    . protriptyline (VIVACTIL) 10 MG tablet Take 10 mg by mouth at bedtime.  3  . colchicine 0.6 MG tablet Take 1 tablet (0.6 mg total) by mouth daily. (Patient not taking: Reported on 11/24/2016) 30 tablet 1   No current facility-administered medications for this encounter.     No Known Allergies    Social History   Social History  . Marital status: Married  Spouse name: N/A  . Number of children: N/A  . Years of education: N/A   Occupational History  . Not on file.   Social History Main Topics  . Smoking status: Current Every Day Smoker  . Smokeless tobacco: Current User  . Alcohol use 0.0 oz/week  . Drug use: Unknown  . Sexual activity: Not on file   Other Topics Concern  . Not on file   Social History Narrative  . No narrative on file      Family History  Problem Relation Age of Onset  . Aneurysm Father 48  . Stroke Paternal Grandfather     Vitals:   04/05/17 1133  BP: 126/84  Pulse: 85  SpO2: 100%  Weight: 184 lb 12 oz (83.8 kg)     PHYSICAL EXAM: General:  Well appearing. No respiratory difficulty HEENT: normal Neck: supple. no JVD. Carotids 2+ bilat; no bruits. No lymphadenopathy or  thyromegaly appreciated. Cor: PMI nondisplaced. Regular rate & rhythm. No rubs, gallops or murmurs. S2 widely split Lungs: clear bilaterally. Normal effort.  Abdomen: soft, nontender, nondistended. No hepatosplenomegaly. No bruits or masses. Good bowel sounds. Extremities: no cyanosis, clubbing, rash, edema Neuro: alert & oriented x 3, cranial nerves grossly intact. moves all 4 extremities w/o difficulty. Affect pleasant.  ECG: NSR, LBBB    ASSESSMENT & PLAN: 1. Chronic systolic CHF: Newly diagnosed, NICM due to LBBB . Echo reviewed by Dr. Haroldine Laws today, EF appears to be closer to 40%, previously read as 25%.  - NYHA I - Volume status stable on exam. Reds vest 26% - Will need LHC to rule out ischemic cause, plan for Friday of this week.  - Will also order repeat sleep study.  - Stop lisinopril - Start losartan 50mg  daily.  - Start Coreg 3.125 mg BID.  - Also will order cardiac MRI to further assess for scar, EF.   2. LBBB - Likely the cause of his cardiomyopathy.   3. HLD - LDL 136. Follows with Dr. Brigitte Pulse.   Follow up in 2 months. Will get all pre cath labs.   Joseph Leas, NP 04/05/17   Patient seen and examined with Jettie Booze, NP. We discussed all aspects of the encounter. I agree with the assessment and plan as stated above.   Echo reviewed personally and discussed with Dr. Johnsie Cancel. EF closer to 40%. He is asymptomatic NYHA I. Volume status looks good. Suspect this is primarily and electrical issue but will need to rule out other causes. Will plan coronary angiography this week followed by cMRI to rule out infiltrative process. Start losartan. May need sleep study.   We discussed possibility of CRT if EF should deteriorate down the road.   Glori Bickers, MD  9:09 PM

## 2017-04-08 NOTE — Interval H&P Note (Signed)
History and Physical Interval Note:  04/08/2017 12:39 PM  Joseph Parrish  has presented today for surgery, with the diagnosis of cm  The various methods of treatment have been discussed with the patient and family. After consideration of risks, benefits and other options for treatment, the patient has consented to  Procedure(s): Left Heart Cath and Coronary Angiography (N/A) as a surgical intervention .  The patient's history has been reviewed, patient examined, no change in status, stable for surgery.  I have reviewed the patient's chart and labs.  Questions were answered to the patient's satisfaction.     [name], Quillian Quince

## 2017-04-12 ENCOUNTER — Other Ambulatory Visit (HOSPITAL_COMMUNITY): Payer: Self-pay | Admitting: Internal Medicine

## 2017-04-25 ENCOUNTER — Encounter: Payer: BLUE CROSS/BLUE SHIELD | Admitting: Internal Medicine

## 2017-05-06 ENCOUNTER — Encounter: Payer: Self-pay | Admitting: *Deleted

## 2017-06-05 ENCOUNTER — Ambulatory Visit (HOSPITAL_BASED_OUTPATIENT_CLINIC_OR_DEPARTMENT_OTHER): Payer: BLUE CROSS/BLUE SHIELD | Attending: Internal Medicine | Admitting: Cardiology

## 2017-06-05 VITALS — Ht 68.0 in | Wt 182.0 lb

## 2017-06-05 DIAGNOSIS — G4733 Obstructive sleep apnea (adult) (pediatric): Secondary | ICD-10-CM | POA: Diagnosis not present

## 2017-06-05 DIAGNOSIS — I429 Cardiomyopathy, unspecified: Secondary | ICD-10-CM | POA: Diagnosis not present

## 2017-06-07 NOTE — Procedures (Signed)
   Patient Name: Joseph Parrish, Joseph Parrish Date: 06/05/2017 Gender: Male D.O.B: 03-21-1966 Age (years): 83 Referring Provider: Shaune Pascal Bensimhon Height (inches): 68 Interpreting Physician: Fransico Him MD, ABSM Weight (lbs): 182 RPSGT: Madelon Lips BMI: 28 MRN: [id number] Neck Size: 16.00  CLINICAL INFORMATION Sleep Study Type: NPSG  Indication for sleep study: OSA  Epworth Sleepiness Score: 7  SLEEP STUDY TECHNIQUE As per the AASM Manual for the Scoring of Sleep and Associated Events v2.3 (April 2016) with a hypopnea requiring 4% desaturations.  The channels recorded and monitored were frontal, central and occipital EEG, electrooculogram (EOG), submentalis EMG (chin), nasal and oral airflow, thoracic and abdominal wall motion, anterior tibialis EMG, snore microphone, electrocardiogram, and pulse oximetry.  MEDICATIONS Medications self-administered by patient taken the night of the study : CARVEDILOL, LOSARTAN, PROTRIPTYLINE, ZYRTEC  SLEEP ARCHITECTURE The study was initiated at 11:08:15 PM and ended at 5:09:35 AM.  Sleep onset time was 23.7 minutes and the sleep efficiency was 85.8%. The total sleep time was 310.1 minutes.  Stage REM latency was 118.5 minutes.  The patient spent 18.38% of the night in stage N1 sleep, 71.78% in stage N2 sleep, 0.00% in stage N3 and 9.84% in REM.  Alpha intrusion was absent.  Supine sleep was 41.19%.  RESPIRATORY PARAMETERS The overall apnea/hypopnea index (AHI) was 4.8 per hour. There were 0 total apneas, including 0 obstructive, 0 central and 0 mixed apneas. There were 25 hypopneas and 10 RERAs.  The AHI during Stage REM sleep was 21.6 per hour.  AHI while supine was 7.5 per hour.  The mean oxygen saturation was 91.73%. The minimum SpO2 during sleep was 84.00%.  Loud snoring was noted during this study.  CARDIAC DATA The 2 lead EKG demonstrated sinus rhythm. The mean heart rate was 89.11 beats per minute. Other EKG findings  include: None.  LEG MOVEMENT DATA The total PLMS were 0 with a resulting PLMS index of 0.00. Associated arousal with leg movement index was 0.0 .  IMPRESSIONS - No significant obstructive sleep apnea overall occurred during this study (AHI = 4.8/h) but patient has significant OSA in REM sleep with oxygen desaturations. - No significant central sleep apnea occurred during this study (CAI = 0.0/h). - Oxygen desaturation was noted during this study (Min O2 = 84.00%). - The patient snored with Loud snoring volume. - No cardiac abnormalities were noted during this study. - Clinically significant periodic limb movements did not occur during sleep. No significant associated arousals.  DIAGNOSIS - Moderate REM specific OSA.  RECOMMENDATIONS - Given significant OSA during REM sleep only with associated Oxygen desaturations, recommend a trial auto CPAP from 4 to 18cm H2O with heated humidity and mask of choice. - Avoid alcohol, sedatives and other CNS depressants that may worsen sleep apnea and disrupt normal sleep architecture. - Sleep hygiene should be reviewed to assess factors that may improve sleep quality. - Weight management and regular exercise should be initiated or continued if appropriate. - Followup in sleep  clinic in 10 weeks.  [place], American Board of Sleep Medicine  ELECTRONICALLY SIGNED ON:  06/07/2017, 12:33 PM [place] PH: [phone]   FX: [phone] [place]

## 2017-06-08 ENCOUNTER — Telehealth: Payer: Self-pay | Admitting: *Deleted

## 2017-06-08 NOTE — Telephone Encounter (Signed)
LMTCB

## 2017-06-08 NOTE — Telephone Encounter (Signed)
-----   Message from Sueanne Margarita, MD sent at 06/07/2017 12:36 PM EDT ----- Please let patient know that they have mild sleep apnea during dream state of sleep with oxygen desaturations and recommend 2 weeks auto CPAP titration at home from 4 to 18cm H2O. Please set up titration

## 2017-06-09 ENCOUNTER — Encounter (HOSPITAL_COMMUNITY): Payer: Self-pay | Admitting: Internal Medicine

## 2017-06-09 ENCOUNTER — Ambulatory Visit (HOSPITAL_COMMUNITY)
Admission: RE | Admit: 2017-06-09 | Discharge: 2017-06-09 | Disposition: A | Discharge status: Home / Self Care | Payer: BLUE CROSS/BLUE SHIELD | Source: Ambulatory Visit | Attending: Internal Medicine | Admitting: Internal Medicine

## 2017-06-09 VITALS — BP 119/83 | HR 77 | Wt 195.8 lb

## 2017-06-09 DIAGNOSIS — M109 Gout, unspecified: Secondary | ICD-10-CM | POA: Insufficient documentation

## 2017-06-09 DIAGNOSIS — I5022 Chronic systolic (congestive) heart failure: Secondary | ICD-10-CM | POA: Diagnosis not present

## 2017-06-09 DIAGNOSIS — E785 Hyperlipidemia, unspecified: Secondary | ICD-10-CM | POA: Diagnosis not present

## 2017-06-09 DIAGNOSIS — I110 Hypertensive heart disease with heart failure: Secondary | ICD-10-CM | POA: Insufficient documentation

## 2017-06-09 DIAGNOSIS — I429 Cardiomyopathy, unspecified: Secondary | ICD-10-CM | POA: Diagnosis not present

## 2017-06-09 DIAGNOSIS — I447 Left bundle-branch block, unspecified: Secondary | ICD-10-CM | POA: Diagnosis not present

## 2017-06-09 NOTE — Progress Notes (Signed)
[facility] Consult Note   Referring Physician: Dr. Brigitte Pulse Primary Care: Dr. Brigitte Pulse Primary Cardiologist: New to Dr. Haroldine Laws   HPI: Joseph Parrish is a 51 year old male with a past medical history of HTN, gout. Newly diagnosed with systolic CHF in June 9528 with EF 25%. He is referred by Dr. Brigitte Pulse for further management of HF.    He was seen in the ED in 10/2016 with abdominal pain, found to have acute cholecystitis. S/p lap chole.   He was seen in Dr. Raul Del office for regular follow up and noted to have a LBBB on EKG. He was then referred for an Echo. EF was reduced at 25% and referred to the HF Clinic for evaluation.  Since we last saw him he underwent cath in 7/18 which showed normal coronary arteries. We ordered cMRI to look for infiltrative or orther processes but this was denied. Also had sleep study with AHI ~ 5. Remains active doing F3. Denies CP or SOB. No edema. Tolerating losartan and carvedilol but does get dizzy occasionally.    LHC 7/18  Ao = 108/73 (88) LV = 110/14  Normal coronary arteries  EF ~40% by V-gram  Assessment:  1. Normal coronaries with left dominant system 2. Mild NICM with EF 40%   Past Medical History:  Diagnosis Date  . CHF (congestive heart failure) ([place])   . Cholecystitis   . Cholelithiasis   . Diverticulosis   . Gout   . Hypertension   . Kidney stone     Current Outpatient Prescriptions  Medication Sig Dispense Refill  . acetaminophen (TYLENOL) 500 MG tablet Take 500-1,000 mg by mouth every 6 (six) hours as needed (for pain.).    Marland Kitchen carvedilol (COREG) 3.125 MG tablet Take 1 tablet (3.125 mg total) by mouth 2 (two) times daily. 180 tablet 3  . colchicine 0.6 MG tablet Take 1 tablet (0.6 mg total) by mouth daily. (Patient taking differently: Take 0.6 mg by mouth daily as needed (for gout flares). ) 30 tablet 1  . ibuprofen (ADVIL,MOTRIN) 200 MG tablet Take 600 mg by mouth every 8 (eight) hours as needed (for pain.).      Marland Kitchen loratadine (CLARITIN) 10 MG tablet Take 10 mg by mouth daily.    Marland Kitchen losartan (COZAAR) 50 MG tablet Take 1 tablet (50 mg total) by mouth daily. 90 tablet 3  . protriptyline (VIVACTIL) 10 MG tablet Take 10 mg by mouth at bedtime.  3   No current facility-administered medications for this encounter.     No Known Allergies    Social History   Social History  . Marital status: Married    Spouse name: N/A  . Number of children: N/A  . Years of education: N/A   Occupational History  . Not on file.   Social History Main Topics  . Smoking status: Never Smoker  . Smokeless tobacco: Never Used  . Alcohol use 0.0 oz/week  . Drug use: Unknown  . Sexual activity: Not on file   Other Topics Concern  . Not on file   Social History Narrative  . No narrative on file      Family History  Problem Relation Age of Onset  . Aneurysm Father 19  . Stroke Paternal Grandfather     Vitals:   06/09/17 1133  BP: 119/83  Pulse: 77  SpO2: 98%  Weight: 195 lb 12.8 oz (88.8 kg)     PHYSICAL EXAM: General:  Well appearing. No resp difficulty  HEENT: normal Neck: supple. no JVD. Carotids 2+ bilat; no bruits. No lymphadenopathy or thryomegaly appreciated. Cor: PMI nondisplaced. Regular rate & rhythm. No rubs, gallops or murmurs. Wide split s2 Lungs: clear Abdomen: soft, nontender, nondistended. No hepatosplenomegaly. No bruits or masses. Good bowel sounds. Extremities: no cyanosis, clubbing, rash, edema Neuro: alert & orientedx3, cranial nerves grossly intact. moves all 4 extremities w/o difficulty. Affect pleasant  ECG: NSR, LBBB    ASSESSMENT & PLAN: 1. Chronic systolic CHF:  - He has NICM with EF 35-40% on previous echo and cath. Bedside echo done today and EF seems closer to 30% - Fortunately remains NYHA I  Volume status ok.  - Continue losartan 50 daily and carvedilol 3.125 bid  - Volume status  Looks good.  - I worry that he has LBBB cardiomyopathy. With worsening EF on echo  will need cMRI to further evaluate for infiltrative or other processes. Will d/w Dr. Caryl Comes as well with regard to need for CRT.  2. LBBB - Likely the cause of his cardiomyopathy.   3. HLD - LDL 136. Follows with Dr. Brigitte Pulse.   4. Pre-operative CV exam - He is cleared to proceed with outpatient coloscopy. I dicussed with Dr. Hilarie Fredrickson.   Glori Bickers, MD 06/09/17

## 2017-06-09 NOTE — Patient Instructions (Signed)
Your physician recommends that you schedule a follow-up appointment in: 2 months  

## 2017-06-14 ENCOUNTER — Ambulatory Visit: Payer: BLUE CROSS/BLUE SHIELD | Admitting: Internal Medicine

## 2017-06-15 ENCOUNTER — Ambulatory Visit: Payer: BLUE CROSS/BLUE SHIELD | Admitting: Internal Medicine

## 2017-06-16 ENCOUNTER — Other Ambulatory Visit: Payer: Self-pay | Admitting: Internal Medicine

## 2017-06-16 ENCOUNTER — Ambulatory Visit (AMBULATORY_SURGERY_CENTER): Payer: Self-pay | Admitting: *Deleted

## 2017-06-16 VITALS — Ht 68.0 in | Wt 193.6 lb

## 2017-06-16 DIAGNOSIS — Z1211 Encounter for screening for malignant neoplasm of colon: Secondary | ICD-10-CM

## 2017-06-16 MED ORDER — NA SULFATE-K SULFATE-MG SULF 17.5-3.13-1.6 GM/177ML PO SOLN: 1.0000 | Freq: Once | ORAL | 0 refills | Status: AC

## 2017-06-16 NOTE — Progress Notes (Signed)
No egg or soy allergy known to patient  No issues with past sedation with any surgeries  or procedures, no intubation problems  No diet pills per patient No home 02 use per patient  No blood thinners per patient  Pt denies issues with constipation  No A fib or A flutter  EMMI video sent to pt's e mail  See Cardio note from 06-09-17-- cardio stated in note EF more 30 %- pending cardiac MRI- Per Dr Hilarie Fredrickson, once the cMRI is done and if his EF is > than 35%, pt can be moved back to the [facility], but until then leave scheduled at [facility] in case is not and remains 30 or less. Pt okay with this plan. Pt is to call once cMRI is scheduled and we will get results and have Dr Hilarie Fredrickson decide location after this procedure is complete- gave instructions today in PV for [facility] 10-30 and if changes to [place], instructed pt we can redo instructions with [place] date and time and mail them or he can pick them up here- suprep sent to pharmacy- pt verbalized understanding of this and is okay with plan- Dr Hilarie Fredrickson did come into [facility] and discuss with pt

## 2017-06-20 NOTE — Telephone Encounter (Signed)
Return: Patient states he had a real bad experience with the sleep study. He felt like he could not breathe and he panicked. He gasps for breathe and almost ripped the mask off his face.  Patient says it was not a full face mask but sat on his nose and under his nose but did not cover his mouth.  Patient wonders if there is another option other than getting a CPAP.

## 2017-06-20 NOTE — Telephone Encounter (Deleted)
-----   Message from Sueanne Margarita, MD sent at 06/07/2017 12:36 PM EDT ----- Please let patient know that they have mild sleep apnea during dream state of sleep with oxygen desaturations and recommend 2 weeks auto CPAP titration at home from 4 to 18cm H2O. Please set up titration

## 2017-06-21 NOTE — Telephone Encounter (Signed)
Lets try a 2 week mask desensitization first and if he does not tolerate that then could try the mouth device

## 2017-06-23 NOTE — Telephone Encounter (Signed)
Informed the patient that Dr Radford Pax recommended a 2 week mask desensitization first and if he does not tolerate that then he could try the oral device. Patient has declined to try the mask desensitization and wants to contact his dentist about the oral device.  Patient states he will contact our office back for an ambulatory referral if his dentist does not offer this service.

## 2017-06-28 ENCOUNTER — Other Ambulatory Visit: Payer: BLUE CROSS/BLUE SHIELD | Admitting: Internal Medicine

## 2017-06-30 ENCOUNTER — Telehealth (HOSPITAL_COMMUNITY): Payer: Self-pay

## 2017-06-30 NOTE — Telephone Encounter (Signed)
Pt requested his results for sleep study be faxed to his dentist at [phone].

## 2017-07-01 NOTE — Telephone Encounter (Signed)
Faxed at 1244 on 06/30/17

## 2017-07-18 ENCOUNTER — Encounter (HOSPITAL_COMMUNITY): Payer: Self-pay

## 2017-07-21 ENCOUNTER — Encounter (HOSPITAL_COMMUNITY): Payer: Self-pay | Admitting: *Deleted

## 2017-07-26 ENCOUNTER — Ambulatory Visit (HOSPITAL_COMMUNITY): Payer: BLUE CROSS/BLUE SHIELD | Admitting: Anesthesiology

## 2017-07-26 ENCOUNTER — Ambulatory Visit (HOSPITAL_COMMUNITY)
Admission: RE | Admit: 2017-07-26 | Discharge: 2017-07-26 | Disposition: A | Discharge status: Home / Self Care | Payer: BLUE CROSS/BLUE SHIELD | Source: Ambulatory Visit | Attending: Internal Medicine | Admitting: Internal Medicine

## 2017-07-26 ENCOUNTER — Encounter (HOSPITAL_COMMUNITY)
Admission: RE | Disposition: A | Discharge status: Home / Self Care | Payer: Self-pay | Source: Ambulatory Visit | Attending: Internal Medicine

## 2017-07-26 ENCOUNTER — Encounter (HOSPITAL_COMMUNITY): Payer: Self-pay | Admitting: *Deleted

## 2017-07-26 DIAGNOSIS — G4733 Obstructive sleep apnea (adult) (pediatric): Secondary | ICD-10-CM | POA: Diagnosis not present

## 2017-07-26 DIAGNOSIS — K219 Gastro-esophageal reflux disease without esophagitis: Secondary | ICD-10-CM | POA: Diagnosis not present

## 2017-07-26 DIAGNOSIS — Z820 Family history of epilepsy and other diseases of the nervous system: Secondary | ICD-10-CM | POA: Insufficient documentation

## 2017-07-26 DIAGNOSIS — Z87442 Personal history of urinary calculi: Secondary | ICD-10-CM | POA: Diagnosis not present

## 2017-07-26 DIAGNOSIS — G43909 Migraine, unspecified, not intractable, without status migrainosus: Secondary | ICD-10-CM | POA: Insufficient documentation

## 2017-07-26 DIAGNOSIS — I5022 Chronic systolic (congestive) heart failure: Secondary | ICD-10-CM | POA: Diagnosis not present

## 2017-07-26 DIAGNOSIS — I429 Cardiomyopathy, unspecified: Secondary | ICD-10-CM | POA: Diagnosis not present

## 2017-07-26 DIAGNOSIS — M109 Gout, unspecified: Secondary | ICD-10-CM | POA: Insufficient documentation

## 2017-07-26 DIAGNOSIS — Z823 Family history of stroke: Secondary | ICD-10-CM | POA: Diagnosis not present

## 2017-07-26 DIAGNOSIS — D122 Benign neoplasm of ascending colon: Secondary | ICD-10-CM

## 2017-07-26 DIAGNOSIS — I110 Hypertensive heart disease with heart failure: Secondary | ICD-10-CM | POA: Diagnosis not present

## 2017-07-26 DIAGNOSIS — Z9049 Acquired absence of other specified parts of digestive tract: Secondary | ICD-10-CM | POA: Insufficient documentation

## 2017-07-26 DIAGNOSIS — Z1211 Encounter for screening for malignant neoplasm of colon: Secondary | ICD-10-CM | POA: Diagnosis not present

## 2017-07-26 HISTORY — DX: Sleep apnea, unspecified: G47.30

## 2017-07-26 HISTORY — DX: Personal history of urinary calculi: Z87.442

## 2017-07-26 HISTORY — DX: Headache, unspecified: R51.9

## 2017-07-26 HISTORY — DX: Headache: R51

## 2017-07-26 HISTORY — PX: COLONOSCOPY WITH PROPOFOL: SHX5780

## 2017-07-26 SURGERY — COLONOSCOPY WITH PROPOFOL
Anesthesia: Monitor Anesthesia Care

## 2017-07-26 MED ORDER — PROPOFOL 10 MG/ML IV BOLUS
INTRAVENOUS | Status: DC | PRN
  Administered 2017-07-26 (×6): 20 mg via INTRAVENOUS

## 2017-07-26 MED ORDER — LIDOCAINE 2% (20 MG/ML) 5 ML SYRINGE
INTRAMUSCULAR | Status: DC | PRN
  Administered 2017-07-26: 100 mg via INTRAVENOUS

## 2017-07-26 MED ORDER — PROPOFOL 500 MG/50ML IV EMUL
INTRAVENOUS | Status: DC | PRN
  Administered 2017-07-26: 100 ug/kg/min via INTRAVENOUS

## 2017-07-26 MED ORDER — PHENYLEPHRINE 40 MCG/ML (10ML) SYRINGE FOR IV PUSH (FOR BLOOD PRESSURE SUPPORT)
PREFILLED_SYRINGE | INTRAVENOUS | Status: DC | PRN
  Administered 2017-07-26 (×2): 80 ug via INTRAVENOUS

## 2017-07-26 MED ORDER — PROPOFOL 10 MG/ML IV BOLUS
INTRAVENOUS | Status: AC
  Filled 2017-07-26: qty 60

## 2017-07-26 MED ORDER — LIDOCAINE 2% (20 MG/ML) 5 ML SYRINGE
INTRAMUSCULAR | Status: AC
  Filled 2017-07-26: qty 5

## 2017-07-26 MED ORDER — LACTATED RINGERS IV SOLN
INTRAVENOUS | Status: DC
  Administered 2017-07-26: 09:00:00 via INTRAVENOUS

## 2017-07-26 MED ORDER — PHENYLEPHRINE 40 MCG/ML (10ML) SYRINGE FOR IV PUSH (FOR BLOOD PRESSURE SUPPORT)
PREFILLED_SYRINGE | INTRAVENOUS | Status: AC
  Filled 2017-07-26: qty 10

## 2017-07-26 MED ORDER — SODIUM CHLORIDE 0.9 % IV SOLN: INTRAVENOUS | Status: DC

## 2017-07-26 SURGICAL SUPPLY — 22 items

## 2017-07-26 NOTE — Transfer of Care (Signed)
Immediate Anesthesia Transfer of Care Note  Patient: Joseph Parrish  Procedure(s) Performed: COLONOSCOPY WITH PROPOFOL (N/A )  Patient Location: Endoscopy Unit  Anesthesia Type:MAC  Level of Consciousness: awake and alert   Airway & Oxygen Therapy: Patient Spontanous Breathing and Patient connected to face mask oxygen  Post-op Assessment: Report given to RN and Post -op Vital signs reviewed and stable  Post vital signs: Reviewed and stable  Last Vitals:  Vitals:   07/26/17 0847  BP: 136/89  Pulse: 86  Resp: 14  Temp: 36.8 C  SpO2: 97%    Last Pain:  Vitals:   07/26/17 0847  TempSrc: Oral         Complications: No apparent anesthesia complications

## 2017-07-26 NOTE — Discharge Instructions (Addendum)

## 2017-07-26 NOTE — Anesthesia Preprocedure Evaluation (Signed)
Anesthesia Evaluation  Patient identified by MRN, date of birth, ID band Patient awake    Reviewed: Allergy & Precautions, NPO status , Patient's Chart, lab work & pertinent test results  Airway Mallampati: II  TM Distance: >3 FB Neck ROM: Full    Dental no notable dental hx.    Pulmonary neg pulmonary ROS,    Pulmonary exam normal breath sounds clear to auscultation       Cardiovascular hypertension, +CHF  Normal cardiovascular exam Rhythm:Regular Rate:Normal  Left ventricle: Diffuse hypokinesis with marked septal dysynergy   The cavity size was severely dilated. Wall thickness was   increased in a pattern of moderate LVH. Systolic function was   moderately reduced. The estimated ejection fraction was in the   range of 35% to 40%. - Atrial septum: No defect or patent foramen ovale was identified   Neuro/Psych negative neurological ROS  negative psych ROS   GI/Hepatic negative GI ROS, Neg liver ROS,   Endo/Other  negative endocrine ROS  Renal/GU negative Renal ROS  negative genitourinary   Musculoskeletal negative musculoskeletal ROS (+)   Abdominal   Peds negative pediatric ROS (+)  Hematology negative hematology ROS (+)   Anesthesia Other Findings   Reproductive/Obstetrics negative OB ROS                             Anesthesia Physical Anesthesia Plan  ASA: III  Anesthesia Plan: MAC   Post-op Pain Management:    Induction: Intravenous  PONV Risk Score and Plan: 0  Airway Management Planned: Simple Face Mask  Additional Equipment:   Intra-op Plan:   Post-operative Plan:   Informed Consent: I have reviewed the patients History and Physical, chart, labs and discussed the procedure including the risks, benefits and alternatives for the proposed anesthesia with the patient or authorized representative who has indicated his/her understanding and acceptance.   Dental  advisory given  Plan Discussed with: CRNA and Surgeon  Anesthesia Plan Comments:         Anesthesia Quick Evaluation

## 2017-07-26 NOTE — Anesthesia Postprocedure Evaluation (Signed)
Anesthesia Post Note  Patient: Joseph Parrish  Procedure(s) Performed: COLONOSCOPY WITH PROPOFOL (N/A )     Patient location during evaluation: PACU Anesthesia Type: MAC Level of consciousness: awake and alert Pain management: pain level controlled Vital Signs Assessment: post-procedure vital signs reviewed and stable Respiratory status: spontaneous breathing, nonlabored ventilation, respiratory function stable and patient connected to nasal cannula oxygen Cardiovascular status: stable and blood pressure returned to baseline Postop Assessment: no apparent nausea or vomiting Anesthetic complications: no    Last Vitals:  Vitals:   07/26/17 1043 07/26/17 1050  BP: 117/69 109/79  Pulse: (!) 101 98  Resp: 12 18  Temp:  36.5 C  SpO2: 98% 96%    Last Pain:  Vitals:   07/26/17 1050  TempSrc: Oral                 Thecla Forgione S

## 2017-07-26 NOTE — Op Note (Signed)
[facility] Patient Name: Joseph Parrish Procedure Date: 07/26/2017 MRN: [id number] Attending MD: Jerene Bears , MD Date of Birth: 07-28-1966 CSN: [id number] Age: 51 Admit Type: Outpatient Procedure:                Colonoscopy Indications:              Screening for colorectal malignant neoplasm, This                            is the patient's first colonoscopy Providers:                Lajuan Lines. Hilarie Fredrickson, MD, Elmer Ramp. Tilden Dome, RN, [employer], Technician, Danley Danker, CRNA Referring MD:             Marton Redwood, M.D. Medicines:                Monitored Anesthesia Care Complications:            No immediate complications. Estimated Blood Loss:     Estimated blood loss was minimal. Procedure:                Pre-Anesthesia Assessment:                           - Prior to the procedure, a History and Physical                            was performed, and patient medications and                            allergies were reviewed. The patient's tolerance of                            previous anesthesia was also reviewed. The risks                            and benefits of the procedure and the sedation                            options and risks were discussed with the patient.                            All questions were answered, and informed consent                            was obtained. Prior Anticoagulants: The patient has                            taken no previous anticoagulant or antiplatelet                            agents. ASA Grade Assessment: III - A patient with  severe systemic disease. After reviewing the risks                            and benefits, the patient was deemed in                            satisfactory condition to undergo the procedure.                           After obtaining informed consent, the colonoscope                            was passed under direct vision. Throughout  the                            procedure, the patient's blood pressure, pulse, and                            oxygen saturations were monitored continuously. The                            EC-[id number] ([id number]) scope was introduced through                            the anus and advanced to the the cecum, identified                            by appendiceal orifice and ileocecal valve. The                            colonoscopy was performed without difficulty. The                            patient tolerated the procedure well. The quality                            of the bowel preparation was excellent. The                            ileocecal valve, appendiceal orifice, and rectum                            were photographed. The bowel preparation used was                            SUPREP. Scope In: 10:20:31 AM Scope Out: 10:35:53 AM Scope Withdrawal Time: 0 hours 11 minutes 22 seconds  Total Procedure Duration: 0 hours 15 minutes 22 seconds  Findings:      The digital rectal exam was normal.      A 4 mm polyp was found in the ascending colon. The polyp was sessile.       The polyp was removed with a cold snare. Resection and retrieval were       complete.      The exam was otherwise  without abnormality on direct and retroflexion       views. Impression:               - One 4 mm polyp in the ascending colon, removed                            with a cold snare. Resected and retrieved.                           - The examination was otherwise normal on direct                            and retroflexion views. Moderate Sedation:      N/A Recommendation:           - Patient has a contact number available for                            emergencies. The signs and symptoms of potential                            delayed complications were discussed with the                            patient. Return to normal activities tomorrow.                            Written discharge instructions  were provided to the                            patient.                           - Resume previous diet.                           - Continue present medications.                           - Await pathology results.                           - Repeat colonoscopy is recommended. The                            colonoscopy date will be determined after pathology                            results from today's exam become available for                            review. Procedure Code(s):        --- Professional ---                           [id number], Colonoscopy, flexible; with removal of  tumor(s), polyp(s), or other lesion(s) by snare                            technique Diagnosis Code(s):        --- Professional ---                           Z12.11, Encounter for screening for malignant                            neoplasm of colon                           D12.2, Benign neoplasm of ascending colon CPT copyright 2016 American Medical Association. All rights reserved. The codes documented in this report are preliminary and upon coder review may  be revised to meet current compliance requirements. Jerene Bears, MD 07/26/2017 10:39:39 AM This report has been signed electronically. Number of Addenda: 0

## 2017-07-26 NOTE — Anesthesia Procedure Notes (Signed)
Date/Time: 07/26/2017 10:09 AM Performed by: Danley Danker L Oxygen Delivery Method: Nasal cannula

## 2017-07-26 NOTE — H&P (Signed)
HPI: Joseph Parrish is a 51 year old male with a past medical history of systolic heart failure diagnosed in June 2018 with an EF around 25%, hypertension and gout who presents for outpatient screening colonoscopy. He is followed by Dr. Haroldine Laws with cardiology and Dr. Brigitte Pulse for primary care.  He denies GI complaint. Never had a colonoscopy. Tolerated the prep well. No abdominal pain, history of diarrhea, constipation, blood in his stool or melena. No family history of colon cancer History of gallstones status post laparoscopic cholecystectomy in February 2018 after presenting with acute abdominal pain and found to have acute cholecystitis.  Past Medical History:  Diagnosis Date  . Allergy   . CHF (congestive heart failure) ([facility])    cardiomyopathy  . Cholecystitis   . Cholelithiasis   . Diverticulosis   . GERD (gastroesophageal reflux disease)    diet   . Gout   . Headache    migraines  . History of kidney stones feb 2018 passed   . Hypertension   . Sleep apnea    new dx sept 2018 looking at dental appliances for mild osa    Past Surgical History:  Procedure Laterality Date  . CHOLECYSTECTOMY  01/2017  . LEFT HEART CATH AND CORONARY ANGIOGRAPHY N/A 04/08/2017   Procedure: Left Heart Cath and Coronary Angiography;  Surgeon: Jolaine Artist, MD;  Location: [facility];  Service: Cardiovascular;  Laterality: N/A;  . TONSILLECTOMY AND ADENOIDECTOMY       (Not in an outpatient encounter)  No Known Allergies  Family History  Problem Relation Age of Onset  . Aneurysm Father 33  . Stroke Paternal Grandfather   . Colon cancer Neg Hx   . Colon polyps Neg Hx   . Esophageal cancer Neg Hx   . Rectal cancer Neg Hx   . Stomach cancer Neg Hx     Social History  Substance Use Topics  . Smoking status: Never Smoker  . Smokeless tobacco: Never Used  . Alcohol use 0.0 oz/week     Comment: socially    ROS: As per history of present illness, otherwise negative  BP  136/89   Pulse 86   Temp 98.2 F (36.8 C) (Oral)   Resp 14   Ht 5\' 8"  (1.727 m)   Wt 192 lb (87.1 kg)   SpO2 97%   BMI 29.19 kg/m  Gen: awake, alert, NAD HEENT: anicteric, op clear CV: RRR, no mrg Pulm: CTA b/l Abd: soft, NT/ND, +BS throughout Ext: no c/c/e Neuro: nonfocal  RELEVANT LABS AND IMAGING: CBC    Component Value Date/Time   WBC 6.9 04/05/2017 1341   RBC 4.99 04/05/2017 1341   HGB 15.8 04/05/2017 1341   HCT 46.3 04/05/2017 1341   PLT 263 04/05/2017 1341   MCV 92.8 04/05/2017 1341   MCH 31.7 04/05/2017 1341   MCHC 34.1 04/05/2017 1341   RDW 13.3 04/05/2017 1341   LYMPHSABS 2,295 03/18/2016 1445   MONOABS 595 03/18/2016 1445   EOSABS 85 03/18/2016 1445   BASOSABS 0 03/18/2016 1445   ASSESSMENT/PLAN:  51 year old male with a past medical history of systolic heart failure diagnosed in June 2018 with an EF around 25%, hypertension and gout who presents for outpatient screening colonoscopy.  1. Average risk screening colonoscopy -- for colonoscopy today with monitored anesthesia care. Performed in the outpatient hospital setting due to history of heart failure with EF of 25-30%.  The nature of the procedure, as well as the risks, benefits, and alternatives were carefully  and thoroughly reviewed with the patient. Ample time for discussion and questions allowed. The patient understood, was satisfied, and agreed to proceed.

## 2017-07-27 ENCOUNTER — Encounter (HOSPITAL_COMMUNITY): Payer: Self-pay | Admitting: Internal Medicine

## 2017-07-28 ENCOUNTER — Encounter: Payer: Self-pay | Admitting: Internal Medicine

## 2017-07-28 NOTE — Telephone Encounter (Signed)
RE: Ambulatory refferral  Joseph Margarita, MD  Freada Bergeron, CMA        He will need to see me first before I can refer   Traci

## 2017-07-28 NOTE — Telephone Encounter (Signed)
Hello Dr Radford Pax,  Patient needs a ambulatory referral with the diagnosis so insurance will pay for an oral device for  Dr. Vickii Penna, [place] , [place]  Thanks

## 2017-08-08 NOTE — Telephone Encounter (Signed)
Called LMTCB. 

## 2017-08-11 ENCOUNTER — Encounter (HOSPITAL_COMMUNITY): Payer: Self-pay | Admitting: Internal Medicine

## 2017-08-11 ENCOUNTER — Other Ambulatory Visit: Payer: Self-pay

## 2017-08-11 ENCOUNTER — Ambulatory Visit (HOSPITAL_COMMUNITY)
Admission: RE | Admit: 2017-08-11 | Discharge: 2017-08-11 | Disposition: A | Discharge status: Home / Self Care | Payer: BLUE CROSS/BLUE SHIELD | Source: Ambulatory Visit | Attending: Internal Medicine | Admitting: Internal Medicine

## 2017-08-11 VITALS — BP 136/91 | HR 83 | Wt 193.8 lb

## 2017-08-11 DIAGNOSIS — I447 Left bundle-branch block, unspecified: Secondary | ICD-10-CM

## 2017-08-11 DIAGNOSIS — I5022 Chronic systolic (congestive) heart failure: Secondary | ICD-10-CM

## 2017-08-11 DIAGNOSIS — M109 Gout, unspecified: Secondary | ICD-10-CM | POA: Diagnosis not present

## 2017-08-11 DIAGNOSIS — I110 Hypertensive heart disease with heart failure: Secondary | ICD-10-CM | POA: Insufficient documentation

## 2017-08-11 DIAGNOSIS — E785 Hyperlipidemia, unspecified: Secondary | ICD-10-CM | POA: Diagnosis not present

## 2017-08-11 DIAGNOSIS — G4733 Obstructive sleep apnea (adult) (pediatric): Secondary | ICD-10-CM | POA: Diagnosis not present

## 2017-08-11 DIAGNOSIS — Z79899 Other long term (current) drug therapy: Secondary | ICD-10-CM | POA: Diagnosis not present

## 2017-08-11 DIAGNOSIS — I429 Cardiomyopathy, unspecified: Secondary | ICD-10-CM | POA: Diagnosis not present

## 2017-08-11 DIAGNOSIS — K219 Gastro-esophageal reflux disease without esophagitis: Secondary | ICD-10-CM | POA: Diagnosis not present

## 2017-08-11 DIAGNOSIS — Z87442 Personal history of urinary calculi: Secondary | ICD-10-CM | POA: Diagnosis not present

## 2017-08-11 MED ORDER — LOSARTAN POTASSIUM 100 MG PO TABS: 100.0000 mg | ORAL_TABLET | Freq: Every day | ORAL | 3 refills | Status: DC

## 2017-08-11 NOTE — Progress Notes (Signed)
Patient is going to get his OSA managed by his dentist and they need a referral.  Referral placed and then emailed to [facility] in [place] at [email or web address] per patients request.

## 2017-08-11 NOTE — Patient Instructions (Addendum)
Increase Losartan to 100 mg daily  Your physician recommends that you schedule a follow-up appointment in: 3 months

## 2017-08-11 NOTE — Progress Notes (Signed)
[facility] pre cert approved. Message sent to [facility] to contact patient and schedule appointment ASAP.  Auth# [id number]  Exp. 09/09/17

## 2017-08-11 NOTE — Progress Notes (Signed)
Advanced [facility] Clinic Consult Note   Referring Physician: Dr. Brigitte Pulse Primary Care: Dr. Brigitte Pulse Primary Cardiologist: New to Dr. Haroldine Laws   HPI: [name] is a 51 year old male with a past medical history of HTN, gout. Newly diagnosed with systolic CHF in June 1829 with EF 25%. He is referred by Dr. Brigitte Pulse for further management of HF.    He was seen in the ED in 10/2016 with abdominal pain, found to have acute cholecystitis. S/p lap chole.   He was seen in Dr. Raul Del office for regular follow up and noted to have a LBBB on EKG. He was then referred for an Echo. EF was reduced at 25% and referred to the HF Clinic for evaluation.  Underwent cath in 7/18 which showed normal coronary arteries. EF 40% We ordered cMRI to look for infiltrative or orther processes but this was denied. Also had sleep study with AHI ~ 5.   Remains active doing F3. Denies CP or SOB. No edema. Tolerating losartan 50  and carvedilol 3.125 bid. Rare dizziness. No edema , CP or orthopnea or PND    LHC 7/18  Ao = 108/73 (88) LV = 110/14  Normal coronary arteries  EF ~40% by V-gram  Assessment:  1. Normal coronaries with left dominant system 2. Mild NICM with EF 40%   Past Medical History:  Diagnosis Date  . Allergy   . CHF (congestive heart failure) (HCC)    cardiomyopathy  . Cholecystitis   . Cholelithiasis   . Diverticulosis   . GERD (gastroesophageal reflux disease)    diet   . Gout   . Headache    migraines  . History of kidney stones feb 2018 passed   . Hypertension   . Sleep apnea    new dx sept 2018 looking at dental appliances for mild osa    Current Outpatient Medications  Medication Sig Dispense Refill  . acetaminophen (TYLENOL) 500 MG tablet Take 500-1,000 mg by mouth every 6 (six) hours as needed (for pain.).    Marland Kitchen carvedilol (COREG) 3.125 MG tablet Take 1 tablet (3.125 mg total) by mouth 2 (two) times daily. 180 tablet 3  . colchicine 0.6 MG tablet Take 0.6 mg daily as  needed by mouth (for gout flares).    Marland Kitchen ibuprofen (ADVIL,MOTRIN) 200 MG tablet Take 600 mg by mouth every 8 (eight) hours as needed (for pain.).    Marland Kitchen loratadine (CLARITIN) 10 MG tablet Take 10 mg by mouth at bedtime.     Marland Kitchen losartan (COZAAR) 50 MG tablet Take 1 tablet (50 mg total) by mouth daily. 90 tablet 3  . protriptyline (VIVACTIL) 10 MG tablet Take 10 mg by mouth at bedtime.  3   No current facility-administered medications for this encounter.     No Known Allergies    Social History   Socioeconomic History  . Marital status: Single    Spouse name: Not on file  . Number of children: Not on file  . Years of education: Not on file  . Highest education level: Not on file  Social Needs  . Financial resource strain: Not on file  . Food insecurity - worry: Not on file  . Food insecurity - inability: Not on file  . Transportation needs - medical: Not on file  . Transportation needs - non-medical: Not on file  Occupational History  . Not on file  Tobacco Use  . Smoking status: Never Smoker  . Smokeless tobacco: Never Used  Substance  and Sexual Activity  . Alcohol use: Yes    Alcohol/week: 0.0 oz    Comment: socially  . Drug use: No  . Sexual activity: Not on file  Other Topics Concern  . Not on file  Social History Narrative  . Not on file      Family History  Problem Relation Age of Onset  . Aneurysm Father 38  . Stroke Paternal Grandfather   . Colon cancer Neg Hx   . Colon polyps Neg Hx   . Esophageal cancer Neg Hx   . Rectal cancer Neg Hx   . Stomach cancer Neg Hx     Vitals:   08/11/17 1114  BP: (!) 136/91  Pulse: 83  SpO2: 100%  Weight: 193 lb 12 oz (87.9 kg)     PHYSICAL EXAM: General:  Well appearing. No resp difficulty HEENT: normal Neck: supple. no JVD. Carotids 2+ bilat; no bruits. No lymphadenopathy or thryomegaly appreciated. Cor: PMI nondisplaced. Regular rate & rhythm. No rubs, gallops or murmurs. Lungs: clear Abdomen: soft, nontender,  nondistended. No hepatosplenomegaly. No bruits or masses. Good bowel sounds. Extremities: no cyanosis, clubbing, rash, edema Neuro: alert & orientedx3, cranial nerves grossly intact. moves all 4 extremities w/o difficulty. Affect pleasant   ECG: NSR, LBBB 132ms Personally reviewed    ASSESSMENT & PLAN: 1. Chronic systolic CHF:  - He has NICM with EF 35-40% on previous echo and cath. - Fortunately remains NYHA I  Volume status ok.  - Increase losartan to 100mg  daily and carvedilol 3.125 bid  - Volume status looks good.  - I worry that he has LBBB cardiomyopathy. With worsening EF on echo will need cMRI to further evaluate for infiltrative or other processes. cMRI approved today in clinic will schedule ASAP Once we have results will d/w Dr. Caryl Comes as well with regard to need for and timing of CRT.  2. LBBB - Likely the cause of his cardiomyopathy.   3. HLD - Follows with Dr. Brigitte Pulse.   Glori Bickers, MD 08/11/17

## 2017-08-11 NOTE — Addendum Note (Signed)
Encounter addended by: Scarlette Calico, RN on: 08/11/2017 5:19 PM  Actions taken: Sign clinical note

## 2017-08-15 ENCOUNTER — Encounter: Payer: Self-pay | Admitting: Internal Medicine

## 2017-08-26 ENCOUNTER — Ambulatory Visit (HOSPITAL_COMMUNITY)
Admission: RE | Admit: 2017-08-26 | Discharge: 2017-08-26 | Disposition: A | Discharge status: Home / Self Care | Payer: BLUE CROSS/BLUE SHIELD | Source: Ambulatory Visit | Attending: Internal Medicine | Admitting: Internal Medicine

## 2017-08-26 DIAGNOSIS — I429 Cardiomyopathy, unspecified: Secondary | ICD-10-CM

## 2017-08-26 LAB — CREATININE, SERUM
Creatinine, Ser: 1.13 mg/dL (ref 0.61–1.24)
GFR calc Af Amer: >60 mL/min (ref >60)
GFR calc non Af Amer: >60 mL/min (ref >60)

## 2017-08-26 MED ORDER — GADOBENATE DIMEGLUMINE 529 MG/ML IV SOLN
30.0000 mL | Freq: Once | INTRAVENOUS | Status: AC | PRN
  Administered 2017-08-26: 30 mL via INTRAVENOUS

## 2017-09-12 NOTE — Telephone Encounter (Signed)
Reached out to patient again and spoke to him on his home phone. Sleep assistant reminded him of the conversation we had about his referral to Dr Eartha Inch, DDS in [place]. Sleep assistant also reminded him that Dr Radford Pax wanted to see him first before referring him to a dentist she doesn't normally refer to. Patient was annoyed that he will not be able to get an appointment before the end of the year and said to give him the first available appointment. Reached out to give the patient an appointment in January but his voicemail was full could not leave a message to call back.

## 2017-09-14 ENCOUNTER — Telehealth (HOSPITAL_COMMUNITY): Payer: Self-pay | Admitting: *Deleted

## 2017-09-14 DIAGNOSIS — I447 Left bundle-branch block, unspecified: Secondary | ICD-10-CM

## 2017-09-14 DIAGNOSIS — I5022 Chronic systolic (congestive) heart failure: Secondary | ICD-10-CM

## 2017-09-14 NOTE — Telephone Encounter (Signed)
Notes recorded by Scarlette Calico, RN on 09/14/2017 at 4:41 PM EST Pt aware and agreeable w/EP referral, order placed

## 2017-09-14 NOTE — Telephone Encounter (Signed)
-----   Message from Jolaine Artist, MD sent at 09/08/2017 12:38 AM EST ----- EF 26%. Needs EP referral for CRT for LBBB cardiomyopathy.

## 2017-09-30 ENCOUNTER — Encounter: Payer: Self-pay | Admitting: Internal Medicine

## 2017-09-30 ENCOUNTER — Ambulatory Visit: Payer: BLUE CROSS/BLUE SHIELD | Admitting: Internal Medicine

## 2017-09-30 VITALS — BP 130/70 | HR 65 | Ht 68.0 in | Wt 198.0 lb

## 2017-09-30 DIAGNOSIS — I429 Cardiomyopathy, unspecified: Secondary | ICD-10-CM

## 2017-09-30 DIAGNOSIS — I447 Left bundle-branch block, unspecified: Secondary | ICD-10-CM

## 2017-09-30 DIAGNOSIS — Z01812 Encounter for preprocedural laboratory examination: Secondary | ICD-10-CM | POA: Diagnosis not present

## 2017-09-30 DIAGNOSIS — I5022 Chronic systolic (congestive) heart failure: Secondary | ICD-10-CM

## 2017-09-30 NOTE — Progress Notes (Signed)
ELECTROPHYSIOLOGY CONSULT NOTE  Patient ID: Joseph Parrish, MRN: [id number], DOB/AGE: [birth year] 51 y.o. Admit date: (Not on file) Date of Consult: 09/30/2017  Primary Physician: Marton Redwood, MD Primary Cardiologist: DB     Joseph Parrish is a 52 y.o. male who is being seen today for the evaluation of LBBB and Cardiomyopathy at the request of DB.    HPI Joseph Parrish is a 52 y.o. male  Referred for consideration of CRT  Was found to have left bundle branch block with "newly diagnosed systolic heart failure "June 2018.  Echocardiogram at that time demonstrated an ejection fraction of 25%.  He was then seen by Dr. Reine Just who undertook an evaluation including catheterization demonstrating no obstructive disease.  He has had serial measurements of LV function without improvement despite guideline directed medical therapy.  He is very fit.  However his significant other, more than he, has noted more fatigue of late.  He has not had edema, orthopnea, nocturnal dyspnea or chest pain.  He has had no syncope.  He does have brief tachycardia palpitations which he feels in his throat and are associated with transient lightheadedness.  His alcohol intake is exuberant and she has expressed concerns about his intake  DATE TEST    6/18 Echo    EF 25 %   7/18 Cath   EF 40 % Normal CA  11/18 CMRI EF 26 LGE neg      Past Medical History:  Diagnosis Date  . Allergy   . CHF (congestive heart failure) (HCC)    cardiomyopathy  . Cholecystitis   . Cholelithiasis   . Diverticulosis   . GERD (gastroesophageal reflux disease)    diet   . Gout   . Headache    migraines  . History of kidney stones feb 2018 passed   . Hypertension   . Sleep apnea    new dx sept 2018 looking at dental appliances for mild osa      Surgical History:  Past Surgical History:  Procedure Laterality Date  . CHOLECYSTECTOMY  01/2017  . COLONOSCOPY WITH PROPOFOL N/A 07/26/2017   Procedure: COLONOSCOPY WITH  PROPOFOL;  Surgeon: Jerene Bears, MD;  Location: [facility];  Service: Gastroenterology;  Laterality: N/A;  . LEFT HEART CATH AND CORONARY ANGIOGRAPHY N/A 04/08/2017   Procedure: Left Heart Cath and Coronary Angiography;  Surgeon: Jolaine Artist, MD;  Location: [facility];  Service: Cardiovascular;  Laterality: N/A;  . TONSILLECTOMY AND ADENOIDECTOMY       Home Meds: Prior to Admission medications   Medication Sig Start Date End Date Taking? Authorizing Provider  acetaminophen (TYLENOL) 500 MG tablet Take 500-1,000 mg by mouth every 6 (six) hours as needed (for pain.).   Yes [provider]  carvedilol (COREG) 3.125 MG tablet Take 1 tablet (3.125 mg total) by mouth 2 (two) times daily. 04/05/17 08/11/18 Yes Bensimhon, Shaune Pascal, MD  colchicine 0.6 MG tablet Take 0.6 mg daily as needed by mouth (for gout flares).   Yes [provider]  ibuprofen (ADVIL,MOTRIN) 200 MG tablet Take 600 mg by mouth every 8 (eight) hours as needed (for pain.).   Yes [provider]  loratadine (CLARITIN) 10 MG tablet Take 10 mg by mouth at bedtime.    Yes [provider]  losartan (COZAAR) 100 MG tablet Take 1 tablet (100 mg total) daily by mouth. 08/11/17 11/09/17 Yes Bensimhon, Shaune Pascal, MD  protriptyline (VIVACTIL) 10 MG tablet Take 10 mg  by mouth at bedtime. 03/14/16  Yes [provider]    Allergies: No Known Allergies  Social History   Socioeconomic History  . Marital status: Single    Spouse name: Not on file  . Number of children: Not on file  . Years of education: Not on file  . Highest education level: Not on file  Social Needs  . Financial resource strain: Not on file  . Food insecurity - worry: Not on file  . Food insecurity - inability: Not on file  . Transportation needs - medical: Not on file  . Transportation needs - non-medical: Not on file  Occupational History  . Not on file  Tobacco Use  . Smoking status: Never Smoker  .  Smokeless tobacco: Never Used  Substance and Sexual Activity  . Alcohol use: Yes    Alcohol/week: 0.0 oz    Comment: socially  . Drug use: No  . Sexual activity: Not on file  Other Topics Concern  . Not on file  Social History Narrative  . Not on file     Family History  Problem Relation Age of Onset  . Aneurysm Father 18  . Stroke Paternal Grandfather   . Colon cancer Neg Hx   . Colon polyps Neg Hx   . Esophageal cancer Neg Hx   . Rectal cancer Neg Hx   . Stomach cancer Neg Hx      ROS:  Please see the history of present illness.     All other systems reviewed and negative.    Physical Exam:  Blood pressure 130/70, pulse 65, height 5\' 8"  (1.727 m), weight 198 lb (89.8 kg). General: Well developed, well nourished male in no acute distress. Head: Normocephalic, atraumatic, sclera non-icteric, no xanthomas, nares are without discharge. EENT: normal  Lymph Nodes:  none Neck: Negative for carotid bruits. JVD 6-7 with positive HJR Back:without scoliosis kyphosis  Lungs: Clear bilaterally to auscultation without wheezes, rales, or rhonchi. Breathing is unlabored. Heart: RRR with S1 S2. No  murmur . No rubs, or gallops appreciated.  PMI displaced Abdomen: Soft, non-tender, non-distended with normoactive bowel sounds. No hepatomegaly. No rebound/guarding. No obvious abdominal masses. Msk:  Strength and tone appear normal for age. Extremities: No clubbing or cyanosis. No edema.  Distal pedal pulses are 2+ and equal bilaterally. Skin: Warm and Dry Neuro: Alert and oriented X 3. CN III-XII intact Grossly normal sensory and motor function . Psych:  Responds to questions appropriately with a normal affect.      Labs: Cardiac Enzymes No results for input(s): CKTOTAL, CKMB, TROPONINI in the last 72 hours. CBC Lab Results  Component Value Date   WBC 6.9 04/05/2017   HGB 15.8 04/05/2017   HCT 46.3 04/05/2017   MCV 92.8 04/05/2017   PLT 263 04/05/2017   PROTIME: No results  for input(s): LABPROT, INR in the last 72 hours. Chemistry No results for input(s): NA, K, CL, CO2, BUN, CREATININE, CALCIUM, PROT, BILITOT, ALKPHOS, ALT, AST, GLUCOSE in the last 168 hours.  Invalid input(s): LABALBU Lipids No results found for: CHOL, HDL, LDLCALC, TRIG BNP No results found for: PROBNP Thyroid Function Tests: No results for input(s): TSH, T4TOTAL, T3FREE, THYROIDAB in the last 72 hours.  Invalid input(s): FREET3 Miscellaneous No results found for: DDIMER  Radiology/Studies:  No results found.  EKG: Sinus at 65 17/15/40 1+ left bundle branch block   Assessment and Plan:  Left bundle branch block  Nonischemic cardiomyopathy  Congestive heart failure-class II  Patient has persistent left ventricular dysfunction despite guideline directed medical therapy and mild congestive heart failure.  It is reasonable to pursue cardiac resynchronization and ICD implantation  The mechanism of his cardiomyopathy is not clear.  If it is left bundle branch block, the likelihood of improvement is considerably greater than if it is a nonischemic myopathy with secondary left bundle.  In either case the strategies are the same.  Would anticipate efforts to place a His bundle lead prior to LV lead placement.  Have reviewed the potential benefits and risks of ICD implantation including but not limited to death, perforation of heart or lung, lead dislodgement, infection,  device malfunction and inappropriate shocks.  The patient and familyexpress understanding  and are willing to proceed.        Virl Axe

## 2017-09-30 NOTE — Patient Instructions (Addendum)
Medication Instructions: Your physician recommends that you continue on your current medications as directed. Please refer to the Current Medication list given to you today.  Labwork: Your physician has recommended that you return for lab work Monday 11/28/17: BMET, CBC,   Procedures/Testing: Your physician has recommended that you have a Defibrillator (ICD) inserted. An implantable cardioverter defibrillator (ICD) is a small device that is placed in your chest or, in rare cases, your abdomen. This device uses electrical pulses or shocks to help control life-threatening, irregular heartbeats that could lead the heart to suddenly stop beating (sudden cardiac arrest). Leads are attached to the ICD that goes into your heart. This is done in the hospital and usually requires an overnight stay. Please see the instruction sheet given to you today for more information.  Follow-Up: Your physician recommends that you schedule a follow-up appointment in 10-14 days from 11/30/17 in the [facility] for a wound check  Your physician recommends that you schedule a follow-up appointment in 3 months from 11/30/17 with Dr. Caryl Comes.   If you need a refill on your cardiac medications before your next appointment, please call your pharmacy.   Any Other Special Instructions Will Be Listed Below (If Applicable).  --Please report to the [facility] on 11/30/17 at 6:30 am --Proceed to Admitting which can be found on your left --Nothing to eat or drink after midnight the night prior to procedure --You may take all your medications the morning of your procedure with enough water to get them down safely --Please wash your chest and neck with the surgical scrub provided for you. Use half the bottle the night before of and half the morning the procedure --Plan for an overight stay --You must have someone to drive you home    If you need a refill on your cardiac medications before your  next appointment, please call your pharmacy.

## 2017-10-04 DIAGNOSIS — Z23 Encounter for immunization: Secondary | ICD-10-CM | POA: Diagnosis not present

## 2017-10-12 DIAGNOSIS — Z23 Encounter for immunization: Secondary | ICD-10-CM | POA: Diagnosis not present

## 2017-11-10 ENCOUNTER — Encounter (HOSPITAL_COMMUNITY): Payer: BLUE CROSS/BLUE SHIELD | Admitting: Internal Medicine

## 2017-11-17 ENCOUNTER — Telehealth: Payer: Self-pay | Admitting: Internal Medicine

## 2017-11-17 NOTE — Telephone Encounter (Signed)
I left a message on the patient's voice mail (ok per DPR) that we needed to push his arrival time back for his procedure on 11/30/17 with Dr. Caryl Comes.  I asked that he arrive at 9:30 am instead of 6:30 am, but that all other instructions will be the same.  I asked that he call back to the [place] office with any further questions.

## 2017-11-25 ENCOUNTER — Encounter (HOSPITAL_COMMUNITY): Payer: BLUE CROSS/BLUE SHIELD | Admitting: Internal Medicine

## 2017-11-28 ENCOUNTER — Other Ambulatory Visit: Payer: BLUE CROSS/BLUE SHIELD | Admitting: *Deleted

## 2017-11-28 DIAGNOSIS — Z01812 Encounter for preprocedural laboratory examination: Secondary | ICD-10-CM

## 2017-11-28 DIAGNOSIS — I5022 Chronic systolic (congestive) heart failure: Secondary | ICD-10-CM | POA: Diagnosis not present

## 2017-11-28 DIAGNOSIS — I429 Cardiomyopathy, unspecified: Secondary | ICD-10-CM | POA: Diagnosis not present

## 2017-11-28 DIAGNOSIS — I447 Left bundle-branch block, unspecified: Secondary | ICD-10-CM | POA: Diagnosis not present

## 2017-11-28 LAB — BASIC METABOLIC PANEL
BUN / CREAT RATIO: 12 (ref 9–20)
BUN: 15 mg/dL (ref 6–24)
CO2: 23 mmol/L (ref 20–29)
CREATININE: 1.29 mg/dL — AB (ref 0.76–1.27)
Calcium: 9.2 mg/dL (ref 8.7–10.2)
Chloride: 100 mmol/L (ref 96–106)
GFR, EST AFRICAN AMERICAN: 74 mL/min/{1.73_m2} (ref >59)
GFR, EST NON AFRICAN AMERICAN: 64 mL/min/{1.73_m2} (ref >59)
GLUCOSE: 86 mg/dL (ref 65–99)
Potassium: 4.4 mmol/L (ref 3.5–5.2)
Sodium: 139 mmol/L (ref 134–144)

## 2017-11-29 LAB — CBC WITH DIFFERENTIAL/PLATELET
BASOS ABS: 0.1 10*3/uL (ref 0.0–0.2)
BASOS: 0 %
EOS (ABSOLUTE): 0.1 10*3/uL (ref 0.0–0.4)
Eos: 1 %
Hematocrit: 43.1 % (ref 37.5–51.0)
Hemoglobin: 15.2 g/dL (ref 13.0–17.7)
IMMATURE GRANS (ABS): 0.1 10*3/uL (ref 0.0–0.1)
Immature Granulocytes: 1 %
LYMPHS: 20 %
Lymphocytes Absolute: 2.9 10*3/uL (ref 0.7–3.1)
MCH: 32.3 pg (ref 26.6–33.0)
MCHC: 35.3 g/dL (ref 31.5–35.7)
MCV: 92 fL (ref 79–97)
MONOCYTES: 6 %
Monocytes Absolute: 0.9 10*3/uL (ref 0.1–0.9)
NEUTROS ABS: 10 10*3/uL — AB (ref 1.4–7.0)
Neutrophils: 72 %
Platelets: 357 10*3/uL (ref 150–379)
RBC: 4.7 x10E6/uL (ref 4.14–5.80)
RDW: 12.9 % (ref 12.3–15.4)
WBC: 14.1 10*3/uL — ABNORMAL HIGH (ref 3.4–10.8)

## 2017-11-30 ENCOUNTER — Ambulatory Visit (HOSPITAL_COMMUNITY)
Admission: RE | Admit: 2017-11-30 | Discharge: 2017-11-30 | Disposition: A | Discharge status: Home / Self Care | Payer: BLUE CROSS/BLUE SHIELD | Source: Ambulatory Visit | Attending: Internal Medicine | Admitting: Internal Medicine

## 2017-11-30 ENCOUNTER — Encounter (HOSPITAL_COMMUNITY)
Admission: RE | Disposition: A | Discharge status: Home / Self Care | Payer: Self-pay | Source: Ambulatory Visit | Attending: Internal Medicine

## 2017-11-30 DIAGNOSIS — G4733 Obstructive sleep apnea (adult) (pediatric): Secondary | ICD-10-CM | POA: Insufficient documentation

## 2017-11-30 DIAGNOSIS — I110 Hypertensive heart disease with heart failure: Secondary | ICD-10-CM | POA: Diagnosis not present

## 2017-11-30 DIAGNOSIS — I429 Cardiomyopathy, unspecified: Secondary | ICD-10-CM | POA: Insufficient documentation

## 2017-11-30 DIAGNOSIS — G43909 Migraine, unspecified, not intractable, without status migrainosus: Secondary | ICD-10-CM | POA: Insufficient documentation

## 2017-11-30 DIAGNOSIS — Z5309 Procedure and treatment not carried out because of other contraindication: Secondary | ICD-10-CM | POA: Insufficient documentation

## 2017-11-30 DIAGNOSIS — R509 Fever, unspecified: Secondary | ICD-10-CM | POA: Diagnosis not present

## 2017-11-30 DIAGNOSIS — D72829 Elevated white blood cell count, unspecified: Secondary | ICD-10-CM | POA: Diagnosis not present

## 2017-11-30 DIAGNOSIS — I5022 Chronic systolic (congestive) heart failure: Secondary | ICD-10-CM

## 2017-11-30 DIAGNOSIS — K219 Gastro-esophageal reflux disease without esophagitis: Secondary | ICD-10-CM | POA: Diagnosis not present

## 2017-11-30 DIAGNOSIS — M109 Gout, unspecified: Secondary | ICD-10-CM | POA: Insufficient documentation

## 2017-11-30 DIAGNOSIS — I447 Left bundle-branch block, unspecified: Secondary | ICD-10-CM | POA: Diagnosis not present

## 2017-11-30 DIAGNOSIS — I428 Other cardiomyopathies: Secondary | ICD-10-CM

## 2017-11-30 LAB — SURGICAL PCR SCREEN
MRSA, PCR: NEGATIVE
STAPHYLOCOCCUS AUREUS: NEGATIVE

## 2017-11-30 SURGERY — ICD IMPLANT

## 2017-11-30 MED ORDER — CEFAZOLIN SODIUM-DEXTROSE 2-4 GM/100ML-% IV SOLN: 2.0000 g | INTRAVENOUS | Status: DC

## 2017-11-30 MED ORDER — CEFAZOLIN SODIUM-DEXTROSE 2-4 GM/100ML-% IV SOLN
INTRAVENOUS | Status: AC
  Filled 2017-11-30: qty 100

## 2017-11-30 MED ORDER — LIDOCAINE HCL (PF) 1 % IJ SOLN
INTRAMUSCULAR | Status: AC
  Filled 2017-11-30: qty 30

## 2017-11-30 MED ORDER — MUPIROCIN 2 % EX OINT
1.0000 "application " | TOPICAL_OINTMENT | Freq: Once | CUTANEOUS | Status: AC
  Administered 2017-11-30: 1 via TOPICAL
  Filled 2017-11-30: qty 22

## 2017-11-30 MED ORDER — SODIUM CHLORIDE 0.9 % IV SOLN
INTRAVENOUS | Status: DC
  Administered 2017-11-30: 10:00:00 via INTRAVENOUS

## 2017-11-30 MED ORDER — CHLORHEXIDINE GLUCONATE 4 % EX LIQD
60.0000 mL | Freq: Once | CUTANEOUS | Status: DC
  Filled 2017-11-30: qty 60

## 2017-11-30 MED ORDER — SODIUM CHLORIDE 0.9 % IR SOLN
Status: AC
  Filled 2017-11-30: qty 2

## 2017-11-30 MED ORDER — MUPIROCIN 2 % EX OINT
TOPICAL_OINTMENT | CUTANEOUS | Status: AC
  Administered 2017-11-30: 1 via TOPICAL
  Filled 2017-11-30: qty 22

## 2017-11-30 MED ORDER — SODIUM CHLORIDE 0.9 % IR SOLN: 80.0000 mg | Status: DC

## 2017-11-30 NOTE — H&P (View-Only) (Signed)
Patient Care Team: Marton Redwood, MD as PCP - General (Internal Medicine)   HPI  Joseph Parrish is a 52 y.o. male   Admitted for CRT D implantation    Was found to have left bundle branch block with "newly diagnosed systolic heart failure "June 2018.  On Guideline directed medical therapy   He is very fit.  However his significant other, more than he, has noted more fatigue of late.  His alcohol intake hasexuberant and his significant other has expressed concerns about his intake  DATE TEST    6/18 Echo    EF 25 %   7/18 Cath   EF 40 % Normal CA  11/18 CMRI EF 26 LGE neg    Traveling a lot without exercise No chest pain sob or edema  Just returned from trip to [place]  No sob , no cough  No swelling  No sore throat or URI sx  Records and Results Reviewed   Past Medical History:  Diagnosis Date  . Allergy   . CHF (congestive heart failure) (HCC)    cardiomyopathy  . Cholecystitis   . Cholelithiasis   . Diverticulosis   . GERD (gastroesophageal reflux disease)    diet   . Gout   . Headache    migraines  . History of kidney stones feb 2018 passed   . Hypertension   . Sleep apnea    new dx sept 2018 looking at dental appliances for mild osa    Past Surgical History:  Procedure Laterality Date  . CHOLECYSTECTOMY  01/2017  . COLONOSCOPY WITH PROPOFOL N/A 07/26/2017   Procedure: COLONOSCOPY WITH PROPOFOL;  Surgeon: Jerene Bears, MD;  Location: [facility];  Service: Gastroenterology;  Laterality: N/A;  . LEFT HEART CATH AND CORONARY ANGIOGRAPHY N/A 04/08/2017   Procedure: Left Heart Cath and Coronary Angiography;  Surgeon: Jolaine Artist, MD;  Location: [facility];  Service: Cardiovascular;  Laterality: N/A;  . TONSILLECTOMY AND ADENOIDECTOMY      Current Facility-Administered Medications  Medication Dose Route Frequency Provider Last Rate Last Dose  . 0.9 %  sodium chloride infusion   Intravenous Continuous Deboraha Sprang, MD 50  mL/hr at 11/30/17 1019    . 0.9 %  sodium chloride infusion   Intravenous Continuous Deboraha Sprang, MD 50 mL/hr at 11/30/17 1019    . ceFAZolin (ANCEF) IVPB 2g/100 mL premix  2 g Intravenous On Call Deboraha Sprang, MD      . chlorhexidine (HIBICLENS) 4 % liquid 4 application  60 mL Topical Once Deboraha Sprang, MD      . gentamicin (GARAMYCIN) 80 mg in sodium chloride irrigation 0.9 % 500 mL irrigation  80 mg Irrigation On Call Deboraha Sprang, MD        No Known Allergies    Social History   Tobacco Use  . Smoking status: Never Smoker  . Smokeless tobacco: Never Used  Substance Use Topics  . Alcohol use: Yes    Alcohol/week: 0.0 oz    Comment: socially  . Drug use: No     Family History  Problem Relation Age of Onset  . Aneurysm Father 78  . Stroke Paternal Grandfather   . Colon cancer Neg Hx   . Colon polyps Neg Hx   . Esophageal cancer Neg Hx   . Rectal cancer Neg Hx   . Stomach cancer Neg Hx      Current Meds  Medication Sig  .  acetaminophen (TYLENOL) 500 MG tablet Take 500-1,000 mg by mouth every 6 (six) hours as needed for moderate pain or headache.   Marland Kitchen atovaquone-proguanil (MALARONE) 250-100 MG TABS tablet Take 1 tablet by mouth daily.  . carvedilol (COREG) 3.125 MG tablet Take 1 tablet (3.125 mg total) by mouth 2 (two) times daily.  Marland Kitchen loratadine (CLARITIN) 10 MG tablet Take 10 mg by mouth at bedtime.   Marland Kitchen losartan (COZAAR) 100 MG tablet Take 1 tablet (100 mg total) daily by mouth.  . protriptyline (VIVACTIL) 10 MG tablet Take 10 mg by mouth at bedtime.     Review of Systems negative except from HPI and PMH  Physical Exam BP 122/83   Pulse (!) 101   Temp 99.4 F (37.4 C) (Oral)   Resp 18   Ht 5\' 8"  (1.727 m)   Wt 195 lb (88.5 kg)   SpO2 97%   BMI 29.65 kg/m  Well developed and well nourished in no acute distress HENT normal E scleral and icterus clear Neck Supple JVP flat; carotids brisk and full Clear to ausculation Regular rate and rhythm, no  murmurs gallops or rub Soft with active bowel sounds No clubbing cyanosis  Edema no swelling asymmentrically Alert and oriented, grossly normal motor and sensory function Skin Warm and Dry  ECG sinus with LBBB QRS d 150 or so  Assessment and  Plan  NICM  LBBB  Sinus tach >> 75 on my repeat  Fever low grade   WBC 14.1<<6 k   Asymptomatic with fever but leukocytosis is also concerning

## 2017-11-30 NOTE — H&P (Signed)
Patient Care Team: Marton Redwood, MD as PCP - General (Internal Medicine)   HPI  Joseph Parrish is a 52 y.o. male   Admitted for CRT D implantation    Was found to have left bundle branch block with "newly diagnosed systolic heart failure "June 2018.  On Guideline directed medical therapy   He is very fit.  However his significant other, more than he, has noted more fatigue of late.  His alcohol intake hasexuberant and his significant other has expressed concerns about his intake  DATE TEST    6/18 Echo    EF 25 %   7/18 Cath   EF 40 % Normal CA  11/18 CMRI EF 26 LGE neg    Traveling a lot without exercise No chest pain sob or edema  Just returned from trip to [place]  No sob , no cough  No swelling  No sore throat or URI sx  Records and Results Reviewed   Past Medical History:  Diagnosis Date  . Allergy   . CHF (congestive heart failure) (HCC)    cardiomyopathy  . Cholecystitis   . Cholelithiasis   . Diverticulosis   . GERD (gastroesophageal reflux disease)    diet   . Gout   . Headache    migraines  . History of kidney stones feb 2018 passed   . Hypertension   . Sleep apnea    new dx sept 2018 looking at dental appliances for mild osa    Past Surgical History:  Procedure Laterality Date  . CHOLECYSTECTOMY  01/2017  . COLONOSCOPY WITH PROPOFOL N/A 07/26/2017   Procedure: COLONOSCOPY WITH PROPOFOL;  Surgeon: Jerene Bears, MD;  Location: [facility];  Service: Gastroenterology;  Laterality: N/A;  . LEFT HEART CATH AND CORONARY ANGIOGRAPHY N/A 04/08/2017   Procedure: Left Heart Cath and Coronary Angiography;  Surgeon: Jolaine Artist, MD;  Location: [facility];  Service: Cardiovascular;  Laterality: N/A;  . TONSILLECTOMY AND ADENOIDECTOMY      Current Facility-Administered Medications  Medication Dose Route Frequency Provider Last Rate Last Dose  . 0.9 %  sodium chloride infusion   Intravenous Continuous Deboraha Sprang, MD 50  mL/hr at 11/30/17 1019    . 0.9 %  sodium chloride infusion   Intravenous Continuous Deboraha Sprang, MD 50 mL/hr at 11/30/17 1019    . ceFAZolin (ANCEF) IVPB 2g/100 mL premix  2 g Intravenous On Call Deboraha Sprang, MD      . chlorhexidine (HIBICLENS) 4 % liquid 4 application  60 mL Topical Once Deboraha Sprang, MD      . gentamicin (GARAMYCIN) 80 mg in sodium chloride irrigation 0.9 % 500 mL irrigation  80 mg Irrigation On Call Deboraha Sprang, MD        No Known Allergies    Social History   Tobacco Use  . Smoking status: Never Smoker  . Smokeless tobacco: Never Used  Substance Use Topics  . Alcohol use: Yes    Alcohol/week: 0.0 oz    Comment: socially  . Drug use: No     Family History  Problem Relation Age of Onset  . Aneurysm Father 21  . Stroke Paternal Grandfather   . Colon cancer Neg Hx   . Colon polyps Neg Hx   . Esophageal cancer Neg Hx   . Rectal cancer Neg Hx   . Stomach cancer Neg Hx      Current Meds  Medication Sig  .  acetaminophen (TYLENOL) 500 MG tablet Take 500-1,000 mg by mouth every 6 (six) hours as needed for moderate pain or headache.   Marland Kitchen atovaquone-proguanil (MALARONE) 250-100 MG TABS tablet Take 1 tablet by mouth daily.  . carvedilol (COREG) 3.125 MG tablet Take 1 tablet (3.125 mg total) by mouth 2 (two) times daily.  Marland Kitchen loratadine (CLARITIN) 10 MG tablet Take 10 mg by mouth at bedtime.   Marland Kitchen losartan (COZAAR) 100 MG tablet Take 1 tablet (100 mg total) daily by mouth.  . protriptyline (VIVACTIL) 10 MG tablet Take 10 mg by mouth at bedtime.     Review of Systems negative except from HPI and PMH  Physical Exam BP 122/83   Pulse (!) 101   Temp 99.4 F (37.4 C) (Oral)   Resp 18   Ht 5\' 8"  (1.727 m)   Wt 195 lb (88.5 kg)   SpO2 97%   BMI 29.65 kg/m  Well developed and well nourished in no acute distress HENT normal E scleral and icterus clear Neck Supple JVP flat; carotids brisk and full Clear to ausculation Regular rate and rhythm, no  murmurs gallops or rub Soft with active bowel sounds No clubbing cyanosis  Edema no swelling asymmentrically Alert and oriented, grossly normal motor and sensory function Skin Warm and Dry  ECG sinus with LBBB QRS d 150 or so  Assessment and  Plan  NICM  LBBB  Sinus tach >> 75 on my repeat  Fever low grade   WBC 14.1<<6 k   Asymptomatic with fever but leukocytosis is also concerning

## 2017-11-30 NOTE — Progress Notes (Signed)
Dr. Caryl Comes cancelled procedure today due to elevated WBC. Dr. Caryl Comes spoke with pt whom verbalized understanding.

## 2017-12-01 ENCOUNTER — Telehealth: Payer: Self-pay | Admitting: Internal Medicine

## 2017-12-01 DIAGNOSIS — Z01818 Encounter for other preprocedural examination: Secondary | ICD-10-CM

## 2017-12-01 NOTE — Telephone Encounter (Signed)
New Message  Pt verbalized that he is trying to schedule a procedure with Caryl Comes. Please call

## 2017-12-01 NOTE — Telephone Encounter (Signed)
Pt rescheduled for Monday March 18th at 0930. Pt will arrive at patient registration at 0730. He plans on having labs drawn before his procedure. Surgical soap will also be left up front for him. Pt verbalizes understanding and has no additional questions.

## 2017-12-05 ENCOUNTER — Telehealth: Payer: Self-pay

## 2017-12-05 ENCOUNTER — Other Ambulatory Visit: Payer: Self-pay

## 2017-12-05 DIAGNOSIS — Z01818 Encounter for other preprocedural examination: Secondary | ICD-10-CM

## 2017-12-05 DIAGNOSIS — D72829 Elevated white blood cell count, unspecified: Secondary | ICD-10-CM | POA: Diagnosis not present

## 2017-12-05 NOTE — Telephone Encounter (Signed)
error 

## 2017-12-07 ENCOUNTER — Other Ambulatory Visit: Payer: BLUE CROSS/BLUE SHIELD

## 2017-12-07 DIAGNOSIS — Z01818 Encounter for other preprocedural examination: Secondary | ICD-10-CM

## 2017-12-08 LAB — BASIC METABOLIC PANEL
BUN/Creatinine Ratio: 12 (ref 9–20)
BUN: 17 mg/dL (ref 6–24)
CALCIUM: 9.8 mg/dL (ref 8.7–10.2)
CO2: 24 mmol/L (ref 20–29)
CREATININE: 1.39 mg/dL — AB (ref 0.76–1.27)
Chloride: 105 mmol/L (ref 96–106)
GFR, EST AFRICAN AMERICAN: 68 mL/min/{1.73_m2} (ref >59)
GFR, EST NON AFRICAN AMERICAN: 59 mL/min/{1.73_m2} — AB (ref >59)
Glucose: 99 mg/dL (ref 65–99)
POTASSIUM: 4.8 mmol/L (ref 3.5–5.2)
SODIUM: 145 mmol/L — AB (ref 134–144)

## 2017-12-08 LAB — CBC WITH DIFFERENTIAL/PLATELET
BASOS: 1 %
Basophils Absolute: 0.1 10*3/uL (ref 0.0–0.2)
EOS (ABSOLUTE): 0.2 10*3/uL (ref 0.0–0.4)
EOS: 2 %
HEMATOCRIT: 45.5 % (ref 37.5–51.0)
Hemoglobin: 15.8 g/dL (ref 13.0–17.7)
IMMATURE GRANS (ABS): 0 10*3/uL (ref 0.0–0.1)
IMMATURE GRANULOCYTES: 0 %
LYMPHS: 36 %
Lymphocytes Absolute: 3.1 10*3/uL (ref 0.7–3.1)
MCH: 33 pg (ref 26.6–33.0)
MCHC: 34.7 g/dL (ref 31.5–35.7)
MCV: 95 fL (ref 79–97)
Monocytes Absolute: 0.6 10*3/uL (ref 0.1–0.9)
Monocytes: 7 %
NEUTROS ABS: 4.6 10*3/uL (ref 1.4–7.0)
NEUTROS PCT: 54 %
PLATELETS: 341 10*3/uL (ref 150–379)
RBC: 4.79 x10E6/uL (ref 4.14–5.80)
RDW: 13.5 % (ref 12.3–15.4)
WBC: 8.5 10*3/uL (ref 3.4–10.8)

## 2017-12-12 ENCOUNTER — Ambulatory Visit (HOSPITAL_COMMUNITY)
Admission: RE | Admit: 2017-12-12 | Discharge: 2017-12-13 | Disposition: A | Discharge status: Home / Self Care | Payer: BLUE CROSS/BLUE SHIELD | Source: Ambulatory Visit | Attending: Internal Medicine | Admitting: Internal Medicine

## 2017-12-12 ENCOUNTER — Ambulatory Visit (HOSPITAL_COMMUNITY)
Admission: RE | Disposition: A | Discharge status: Home / Self Care | Payer: Self-pay | Source: Ambulatory Visit | Attending: Internal Medicine

## 2017-12-12 ENCOUNTER — Ambulatory Visit: Payer: BLUE CROSS/BLUE SHIELD

## 2017-12-12 ENCOUNTER — Other Ambulatory Visit: Payer: Self-pay

## 2017-12-12 ENCOUNTER — Encounter (HOSPITAL_COMMUNITY): Payer: Self-pay | Admitting: *Deleted

## 2017-12-12 DIAGNOSIS — I5022 Chronic systolic (congestive) heart failure: Secondary | ICD-10-CM | POA: Insufficient documentation

## 2017-12-12 DIAGNOSIS — I428 Other cardiomyopathies: Secondary | ICD-10-CM | POA: Diagnosis not present

## 2017-12-12 DIAGNOSIS — Z79899 Other long term (current) drug therapy: Secondary | ICD-10-CM | POA: Diagnosis not present

## 2017-12-12 DIAGNOSIS — I447 Left bundle-branch block, unspecified: Secondary | ICD-10-CM | POA: Diagnosis present

## 2017-12-12 DIAGNOSIS — I110 Hypertensive heart disease with heart failure: Secondary | ICD-10-CM | POA: Insufficient documentation

## 2017-12-12 DIAGNOSIS — Z006 Encounter for examination for normal comparison and control in clinical research program: Secondary | ICD-10-CM | POA: Diagnosis not present

## 2017-12-12 DIAGNOSIS — Z959 Presence of cardiac and vascular implant and graft, unspecified: Secondary | ICD-10-CM

## 2017-12-12 HISTORY — PX: BIV ICD INSERTION CRT-D: EP1195

## 2017-12-12 SURGERY — BIV ICD INSERTION CRT-D
Anesthesia: LOCAL

## 2017-12-12 MED ORDER — HEPARIN (PORCINE) IN NACL 2-0.9 UNIT/ML-% IJ SOLN
INTRAMUSCULAR | Status: AC | PRN
  Administered 2017-12-12: 500 mL

## 2017-12-12 MED ORDER — ATOVAQUONE-PROGUANIL HCL 250-100 MG PO TABS
1.0000 | ORAL_TABLET | Freq: Every day | ORAL | Status: DC
  Filled 2017-12-12: qty 1

## 2017-12-12 MED ORDER — LIDOCAINE HCL (PF) 1 % IJ SOLN
INTRAMUSCULAR | Status: DC | PRN
  Administered 2017-12-12: 60 mL

## 2017-12-12 MED ORDER — MIDAZOLAM HCL 5 MG/5ML IJ SOLN
INTRAMUSCULAR | Status: AC
  Filled 2017-12-12: qty 5

## 2017-12-12 MED ORDER — MIDAZOLAM HCL 5 MG/5ML IJ SOLN
INTRAMUSCULAR | Status: DC | PRN
  Administered 2017-12-12 (×3): 1 mg via INTRAVENOUS
  Administered 2017-12-12: 3 mg via INTRAVENOUS
  Administered 2017-12-12: 1 mg via INTRAVENOUS

## 2017-12-12 MED ORDER — IBUPROFEN 600 MG PO TABS
600.0000 mg | ORAL_TABLET | Freq: Three times a day (TID) | ORAL | Status: DC | PRN
  Administered 2017-12-12: 600 mg via ORAL
  Filled 2017-12-12: qty 1

## 2017-12-12 MED ORDER — HEPARIN (PORCINE) IN NACL 2-0.9 UNIT/ML-% IJ SOLN
INTRAMUSCULAR | Status: AC
  Filled 2017-12-12: qty 500

## 2017-12-12 MED ORDER — CEFAZOLIN SODIUM-DEXTROSE 1-4 GM/50ML-% IV SOLN
1.0000 g | Freq: Four times a day (QID) | INTRAVENOUS | Status: AC
  Administered 2017-12-12 – 2017-12-13 (×3): 1 g via INTRAVENOUS
  Filled 2017-12-12 (×3): qty 50

## 2017-12-12 MED ORDER — FENTANYL CITRATE (PF) 100 MCG/2ML IJ SOLN
INTRAMUSCULAR | Status: DC | PRN
  Administered 2017-12-12: 50 ug via INTRAVENOUS
  Administered 2017-12-12 (×4): 25 ug via INTRAVENOUS

## 2017-12-12 MED ORDER — SODIUM CHLORIDE 0.9 % IV SOLN: INTRAVENOUS | Status: AC

## 2017-12-12 MED ORDER — MUPIROCIN 2 % EX OINT: 1.0000 "application " | TOPICAL_OINTMENT | Freq: Once | CUTANEOUS | Status: DC

## 2017-12-12 MED ORDER — ACETAMINOPHEN 500 MG PO TABS
500.0000 mg | ORAL_TABLET | Freq: Four times a day (QID) | ORAL | Status: DC | PRN
Start: 2017-12-12 — End: 2017-12-13
  Administered 2017-12-12: 1000 mg via ORAL

## 2017-12-12 MED ORDER — LIDOCAINE HCL 1 % IJ SOLN
INTRAMUSCULAR | Status: AC
  Filled 2017-12-12: qty 20

## 2017-12-12 MED ORDER — SODIUM CHLORIDE 0.9 % IV SOLN
INTRAVENOUS | Status: DC
  Administered 2017-12-12: 08:00:00 via INTRAVENOUS

## 2017-12-12 MED ORDER — COLCHICINE 0.6 MG PO TABS: 0.6000 mg | ORAL_TABLET | Freq: Every day | ORAL | Status: DC | PRN

## 2017-12-12 MED ORDER — MUPIROCIN 2 % EX OINT
TOPICAL_OINTMENT | CUTANEOUS | Status: AC
  Filled 2017-12-12: qty 22

## 2017-12-12 MED ORDER — CEFAZOLIN SODIUM-DEXTROSE 2-4 GM/100ML-% IV SOLN
INTRAVENOUS | Status: AC
  Filled 2017-12-12: qty 100

## 2017-12-12 MED ORDER — FENTANYL CITRATE (PF) 100 MCG/2ML IJ SOLN
INTRAMUSCULAR | Status: AC
  Filled 2017-12-12: qty 2

## 2017-12-12 MED ORDER — LORATADINE 10 MG PO TABS
10.0000 mg | ORAL_TABLET | Freq: Every day | ORAL | Status: DC
  Administered 2017-12-12: 10 mg via ORAL
  Filled 2017-12-12: qty 1

## 2017-12-12 MED ORDER — LOSARTAN POTASSIUM 50 MG PO TABS
100.0000 mg | ORAL_TABLET | Freq: Every day | ORAL | Status: DC
  Administered 2017-12-12: 100 mg via ORAL
  Filled 2017-12-12 (×2): qty 2

## 2017-12-12 MED ORDER — ACETAMINOPHEN 500 MG PO TABS
ORAL_TABLET | ORAL | Status: AC
  Filled 2017-12-12: qty 2

## 2017-12-12 MED ORDER — CARVEDILOL 3.125 MG PO TABS
6.2500 mg | ORAL_TABLET | Freq: Two times a day (BID) | ORAL | Status: DC
  Administered 2017-12-12 – 2017-12-13 (×2): 6.25 mg via ORAL
  Filled 2017-12-12 (×2): qty 2

## 2017-12-12 MED ORDER — GENTAMICIN SULFATE 40 MG/ML IJ SOLN
INTRAMUSCULAR | Status: AC
  Filled 2017-12-12: qty 2

## 2017-12-12 MED ORDER — SODIUM CHLORIDE 0.9 % IR SOLN
80.0000 mg | Status: AC
  Administered 2017-12-12: 80 mg

## 2017-12-12 MED ORDER — IOPAMIDOL (ISOVUE-370) INJECTION 76%
INTRAVENOUS | Status: AC
  Filled 2017-12-12: qty 50

## 2017-12-12 MED ORDER — PROTRIPTYLINE HCL 10 MG PO TABS: 10.0000 mg | ORAL_TABLET | Freq: Every day | ORAL | Status: DC

## 2017-12-12 MED ORDER — ACETAMINOPHEN 325 MG PO TABS: 325.0000 mg | ORAL_TABLET | ORAL | Status: DC | PRN

## 2017-12-12 MED ORDER — CEFAZOLIN SODIUM-DEXTROSE 2-4 GM/100ML-% IV SOLN
2.0000 g | INTRAVENOUS | Status: AC
  Administered 2017-12-12: 2 g via INTRAVENOUS

## 2017-12-12 MED ORDER — CHLORHEXIDINE GLUCONATE 4 % EX LIQD: 60.0000 mL | Freq: Once | CUTANEOUS | Status: DC

## 2017-12-12 MED ORDER — IOPAMIDOL (ISOVUE-370) INJECTION 76%
INTRAVENOUS | Status: DC | PRN
Start: 2017-12-12 — End: 2017-12-12
  Administered 2017-12-12: 10 mL

## 2017-12-12 MED ORDER — ONDANSETRON HCL 4 MG/2ML IJ SOLN
4.0000 mg | Freq: Four times a day (QID) | INTRAMUSCULAR | Status: DC | PRN
Start: 2017-12-12 — End: 2017-12-13

## 2017-12-12 SURGICAL SUPPLY — 16 items
CABLE SURGICAL S-101-97-12 (CABLE) ×2 IMPLANT
CATH CPS DIRECT 135 DS2C020 (CATHETERS) ×1 IMPLANT
HEMOSTAT SURGICEL 2X4 FIBR (HEMOSTASIS) ×1 IMPLANT
ICD CLARIA MRI DTMA1QQ (ICD Generator) ×1 IMPLANT
KIT ESSENTIALS PG (KITS) ×2 IMPLANT
LEAD CAPSURE NOVUS 5076-52CM (Lead) ×1 IMPLANT
LEAD QUARTET 1458Q-86CM (Lead) ×2 IMPLANT
LEAD RELIANCE G DF4 0293 (Lead) ×1 IMPLANT
PAD DEFIB LIFELINK (PAD) ×2 IMPLANT
SHEATH CLASSIC 7F (SHEATH) ×1 IMPLANT
SHEATH CLASSIC 9.5F (SHEATH) ×1 IMPLANT
SHEATH CLASSIC 9F (SHEATH) ×1 IMPLANT
SLITTER UNIVERSAL DS2A003 (MISCELLANEOUS) ×1 IMPLANT
TRAY PACEMAKER INSERTION (PACKS) ×1 IMPLANT
WIRE ACUITY WHISPER EDS 4648 (WIRE) ×1 IMPLANT
WIRE HI TORQ VERSACORE-J 145CM (WIRE) ×1 IMPLANT

## 2017-12-12 NOTE — Interval H&P Note (Signed)
ICD Criteria  Current LVEF:26%. Within 12 months prior to implant: No   Heart failure history: Yes, Class II  Cardiomyopathy history: Yes, Non-Ischemic Cardiomyopathy.  Atrial Fibrillation/Atrial Flutter: No.  Ventricular tachycardia history: No.  Cardiac arrest history: No.  History of syndromes with risk of sudden death: No.  Previous ICD: No.  Current ICD indication: Primary  PPM indication: No.   Class I or II Bradycardia indication present: No  Beta Blocker therapy for 3 or more months: Yes, prescribed.   Ace Inhibitor/ARB therapy for 3 or more months: Yes, prescribed.   History and Physical Interval Note:  12/12/2017 9:01 AM  Joseph Parrish  has presented today for surgery, with the diagnosis of lbbb  non ischemic cardiomyopathy  The various methods of treatment have been discussed with the patient and family. After consideration of risks, benefits and other options for treatment, the patient has consented to  Procedure(s): BIV ICD INSERTION CRT-D (N/A) as a surgical intervention .  The patient's history has been reviewed, patient examined, no change in status, stable for surgery.  I have reviewed the patient's chart and labs.  Questions were answered to the patient's satisfaction.     Joseph Parrish

## 2017-12-12 NOTE — Interval H&P Note (Signed)
History and Physical Interval Note:  12/12/2017 9:00 AM  Joseph Parrish  has presented today for surgery, with the diagnosis of lbbb  non ischemic cardiomyopathy  The various methods of treatment have been discussed with the patient and family. After consideration of risks, benefits and other options for treatment, the patient has consented to  Procedure(s): BIV ICD INSERTION CRT-D (N/A) as a surgical intervention .  The patient's history has been reviewed, patient examined, no change in status, stable for surgery.  I have reviewed the patient's chart and labs.  Questions were answered to the patient's satisfaction.     Virl Axe  Resolution of infectious signs and labs  Will proceed iwith CRT-D implantation Have reviewed the potential benefits and risks of ICD implantation including but not limited to death, perforation of heart or lung, lead dislodgement, infection,  device malfunction and inappropriate shocks.  The patient and family express understanding  and are willing to proceed.

## 2017-12-12 NOTE — Discharge Summary (Signed)
ELECTROPHYSIOLOGY PROCEDURE DISCHARGE SUMMARY    Patient ID: Joseph Parrish,  MRN: [id number], DOB/AGE: [birth year] 52 y.o.  Admit date: 12/12/2017 Discharge date: 12/13/16  Primary Care Physician: Marton Redwood, MD  Primary Cardiologist: Dr. Haroldine Laws Electrophysiologist: Dr. Caryl Comes  Primary Discharge Diagnosis:  1. NICM 2. LBBB  Secondary Discharge Diagnosis:  1. Chronic CHF (systolic)   Allergies  Allergen Reactions  . Chlorhexidine Rash    Presurgical wipes caused a raised red rash     Procedures This Admission:  1.  Implantation of a MDT CRT-D on 12/12/17 by Dr Caryl Comes.   See full report for details 2.  CXR on demonstrated no pneumothorax status post device implantation.   Brief HPI: Joseph Parrish is a 52 y.o. male was referred to electrophysiology in the outpatient setting for consideration of ICD implantation.  Past medical history is noted above.  The patient has persistent LV dysfunction despite guideline directed therapy.  Risks, benefits, and alternatives to ICD implantation were reviewed with the patient who wished to proceed.   Hospital Course:  The patient was admitted and underwent implantation of an ICD with details as outlined above. He was monitored on telemetry overnight which demonstrated SR.  Left chest was without hematoma or ecchymosis.  The device was interrogated and found to be functioning normally.  CXR was obtained and demonstrated no pneumothorax status post device implantation.  The patient feels well, no CP, palpitations or SOB. Wound care, arm mobility, and restrictions were reviewed with the patient.  The patient was examined by Dr. Caryl Comes and considered stable for discharge to home.   The patient's discharge medications include an ARB  (Losartan) and beta blocker (carvedilol).   Physical Exam: Vitals:   12/12/17 2022 12/13/17 0053 12/13/17 0438 12/13/17 0842  BP: (!) 124/95 106/76 107/74 124/86  Pulse: (!) 102 98 87 85  Resp: 18  17  16   Temp: 98.6 F (37 C) 98.2 F (36.8 C) 98.1 F (36.7 C) 98.2 F (36.8 C)  TempSrc: Oral Oral Oral Oral  SpO2: 94% 96% 95% 95%  Weight:   190 lb 11.2 oz (86.5 kg)   Height:        GEN- The patient is well appearing, alert and oriented x 3 today.   HEENT: normocephalic, atraumatic; sclera clear, conjunctiva pink; hearing intact; oropharynx clear Lungs- CTA b/l, normal work of breathing.  No wheezes, rales, rhonchi Heart- RRR, no murmurs, rubs or gallops, PMI not laterally displaced GI- soft, non-tender, non-distended Extremities- no clubbing, cyanosis, or edema MS- no significant deformity or atrophy Skin- warm and dry, no rash or lesion, left chest without hematoma/ecchymosis Psych- euthymic mood, full affect Neuro- no gross defecits  Labs:   Lab Results  Component Value Date   WBC 8.5 12/07/2017   HGB 15.8 12/07/2017   HCT 45.5 12/07/2017   MCV 95 12/07/2017   PLT 341 12/07/2017    Recent Labs  Lab 12/07/17 1626  NA 145*  K 4.8  CL 105  CO2 24  BUN 17  CREATININE 1.39*  CALCIUM 9.8  GLUCOSE 99    Discharge Medications:  Allergies as of 12/13/2017      Reactions   Chlorhexidine Rash   Presurgical wipes caused a raised red rash      Medication List    TAKE these medications   acetaminophen 500 MG tablet Commonly known as:  TYLENOL Take 500-1,000 mg by mouth every 6 (six) hours as needed for moderate pain or headache.   atovaquone-proguanil  250-100 MG Tabs tablet Commonly known as:  MALARONE Take 1 tablet by mouth daily.   carvedilol 6.25 MG tablet Commonly known as:  COREG Take 1 tablet (6.25 mg total) by mouth 2 (two) times daily. What changed:    medication strength  how much to take   colchicine 0.6 MG tablet Take 0.6 mg daily as needed by mouth (for gout flares).   ibuprofen 200 MG tablet Commonly known as:  ADVIL,MOTRIN Take 600 mg by mouth every 8 (eight) hours as needed for headache or moderate pain.   loratadine 10 MG  tablet Commonly known as:  CLARITIN Take 10 mg by mouth at bedtime.   losartan 100 MG tablet Commonly known as:  COZAAR Take 1 tablet (100 mg total) daily by mouth.   protriptyline 10 MG tablet Commonly known as:  VIVACTIL Take 10 mg by mouth at bedtime.       Disposition:  Home  Discharge Instructions    Diet - low sodium heart healthy   Complete by:  As directed    Increase activity slowly   Complete by:  As directed      Follow-up Information    [facility] Follow up on 12/26/2017.   Specialty:  Cardiology Why:  12:00PM (noon), wound check visit Contact information: [address], [address] [place]       Bensimhon, Shaune Pascal, MD Follow up on 12/26/2017.   Specialty:  Cardiology Why:  2:30PM Contact information: [address] [place] [address] [phone]        Deboraha Sprang, MD Follow up on 03/14/2018.   Specialty:  Cardiology Why:  3:15PM Contact information: [address] [address] [phone]           Duration of Discharge Encounter: Greater than 30 minutes including physician time.  Venetia Night, PA-C 12/13/2017 9:33 AM

## 2017-12-13 ENCOUNTER — Other Ambulatory Visit: Payer: Self-pay

## 2017-12-13 ENCOUNTER — Ambulatory Visit (HOSPITAL_COMMUNITY): Payer: BLUE CROSS/BLUE SHIELD

## 2017-12-13 ENCOUNTER — Encounter (HOSPITAL_COMMUNITY): Payer: Self-pay | Admitting: Internal Medicine

## 2017-12-13 DIAGNOSIS — I428 Other cardiomyopathies: Secondary | ICD-10-CM | POA: Diagnosis not present

## 2017-12-13 DIAGNOSIS — Z950 Presence of cardiac pacemaker: Secondary | ICD-10-CM | POA: Diagnosis not present

## 2017-12-13 DIAGNOSIS — I5022 Chronic systolic (congestive) heart failure: Secondary | ICD-10-CM | POA: Diagnosis not present

## 2017-12-13 DIAGNOSIS — Z006 Encounter for examination for normal comparison and control in clinical research program: Secondary | ICD-10-CM | POA: Diagnosis not present

## 2017-12-13 DIAGNOSIS — I110 Hypertensive heart disease with heart failure: Secondary | ICD-10-CM | POA: Diagnosis not present

## 2017-12-13 DIAGNOSIS — I447 Left bundle-branch block, unspecified: Secondary | ICD-10-CM | POA: Diagnosis not present

## 2017-12-13 DIAGNOSIS — Z79899 Other long term (current) drug therapy: Secondary | ICD-10-CM | POA: Diagnosis not present

## 2017-12-13 MED ORDER — CARVEDILOL 6.25 MG PO TABS: 6.2500 mg | ORAL_TABLET | Freq: Two times a day (BID) | ORAL | 6 refills | Status: DC

## 2017-12-13 MED FILL — Gentamicin Sulfate Inj 40 MG/ML: INTRAMUSCULAR | Qty: 80 | Status: AC

## 2017-12-13 MED FILL — Lidocaine HCl Local Inj 1%: INTRAMUSCULAR | Qty: 60 | Status: AC

## 2017-12-13 MED FILL — Cefazolin Sodium-Dextrose IV Solution 2 GM/100ML-4%: INTRAVENOUS | Qty: 100 | Status: AC

## 2017-12-13 NOTE — Plan of Care (Signed)
  Completed/Met Education: Knowledge of General Education information will improve 12/13/2017 1035 - Completed/Met by Emmaline Life, RN Health Behavior/Discharge Planning: Ability to manage health-related needs will improve 12/13/2017 1035 - Completed/Met by Emmaline Life, RN Clinical Measurements: Ability to maintain clinical measurements within normal limits will improve 12/13/2017 1035 - Completed/Met by Emmaline Life, RN Will remain free from infection 12/13/2017 1035 - Completed/Met by Emmaline Life, RN Diagnostic test results will improve 12/13/2017 1035 - Completed/Met by Emmaline Life, RN Respiratory complications will improve 12/13/2017 1035 - Completed/Met by Emmaline Life, RN Cardiovascular complication will be avoided 12/13/2017 1035 - Completed/Met by Emmaline Life, RN Activity: Risk for activity intolerance will decrease 12/13/2017 1035 - Completed/Met by Emmaline Life, RN Nutrition: Adequate nutrition will be maintained 12/13/2017 1035 - Completed/Met by Emmaline Life, RN Coping: Level of anxiety will decrease 12/13/2017 1035 - Completed/Met by Emmaline Life, RN Elimination: Will not experience complications related to bowel motility 12/13/2017 1035 - Completed/Met by Emmaline Life, RN Will not experience complications related to urinary retention 12/13/2017 1035 - Completed/Met by Emmaline Life, RN Pain Managment: General experience of comfort will improve 12/13/2017 1035 - Completed/Met by Emmaline Life, RN Safety: Ability to remain free from injury will improve 12/13/2017 1035 - Completed/Met by Emmaline Life, RN Skin Integrity: Risk for impaired skin integrity will decrease 12/13/2017 1035 - Completed/Met by Emmaline Life, RN

## 2017-12-13 NOTE — Discharge Instructions (Signed)
° ° °  Supplemental Discharge Instructions for  Pacemaker/Defibrillator Patients  Activity No heavy lifting or vigorous activity with your left/right arm for 6 to 8 weeks.  Do not raise your left/right arm above your head for one week.  Gradually raise your affected arm as drawn below.              12/16/17                    12/17/17                     12/18/17                  12/19/17 __  NO DRIVING for  1 week  ; you may begin driving on   03/24/30  .  WOUND CARE - Keep the wound area clean and dry.  Do not get this area wet for 24 hours; you may shower on  12/14/17   . - do not pull adhesive off.  No bandage is needed on the site.  DO  NOT apply any creams, oils, or ointments to the wound area. - If you notice any drainage or discharge from the wound, any swelling or bruising at the site, or you develop a fever > 101? F after you are discharged home, call the office at once.  Special Instructions - You are still able to use cellular telephones; use the ear opposite the side where you have your pacemaker/defibrillator.  Avoid carrying your cellular phone near your device. - When traveling through airports, show security personnel your identification card to avoid being screened in the metal detectors.  Ask the security personnel to use the hand wand. - Avoid arc welding equipment, MRI testing (magnetic resonance imaging), TENS units (transcutaneous nerve stimulators).  Call the office for questions about other devices. - Avoid electrical appliances that are in poor condition or are not properly grounded. - Microwave ovens are safe to be near or to operate.  Additional information for defibrillator patients should your device go off: - If your device goes off ONCE and you feel fine afterward, notify the device clinic nurses. - If your device goes off ONCE and you do not feel well afterward, call 911. - If your device goes off TWICE, call 911. - If your device goes off THREE times in one day,  call 911.  DO NOT DRIVE YOURSELF OR A FAMILY MEMBER WITH A DEFIBRILLATOR TO THE HOSPITAL--CALL 911.

## 2017-12-13 NOTE — Progress Notes (Signed)
Reviewed discharge instructions/medications with pt. Answered his questions. Pt is stable and ready for discharge.

## 2017-12-14 ENCOUNTER — Telehealth: Payer: Self-pay | Admitting: Cardiology

## 2017-12-14 NOTE — Telephone Encounter (Signed)
Patient called and wanted to make sure his home monitor is working and set up properly. Informed pt that his remote transmission was received and that it is automatic going forward. Pt verbalized understanding.

## 2017-12-19 DIAGNOSIS — L570 Actinic keratosis: Secondary | ICD-10-CM | POA: Diagnosis not present

## 2017-12-19 DIAGNOSIS — D225 Melanocytic nevi of trunk: Secondary | ICD-10-CM | POA: Diagnosis not present

## 2017-12-21 DIAGNOSIS — E785 Hyperlipidemia, unspecified: Secondary | ICD-10-CM | POA: Diagnosis not present

## 2017-12-21 DIAGNOSIS — I10 Essential (primary) hypertension: Secondary | ICD-10-CM | POA: Diagnosis not present

## 2017-12-21 DIAGNOSIS — R7301 Impaired fasting glucose: Secondary | ICD-10-CM | POA: Diagnosis not present

## 2017-12-21 DIAGNOSIS — M1000 Idiopathic gout, unspecified site: Secondary | ICD-10-CM | POA: Diagnosis not present

## 2017-12-26 ENCOUNTER — Ambulatory Visit (HOSPITAL_COMMUNITY)
Admission: RE | Admit: 2017-12-26 | Discharge: 2017-12-26 | Disposition: A | Discharge status: Home / Self Care | Payer: BLUE CROSS/BLUE SHIELD | Source: Ambulatory Visit | Attending: Internal Medicine | Admitting: Internal Medicine

## 2017-12-26 ENCOUNTER — Ambulatory Visit (INDEPENDENT_AMBULATORY_CARE_PROVIDER_SITE_OTHER): Payer: BLUE CROSS/BLUE SHIELD | Admitting: *Deleted

## 2017-12-26 ENCOUNTER — Encounter (HOSPITAL_COMMUNITY): Payer: Self-pay | Admitting: Internal Medicine

## 2017-12-26 VITALS — BP 118/82 | HR 115 | Wt 195.8 lb

## 2017-12-26 DIAGNOSIS — Z87442 Personal history of urinary calculi: Secondary | ICD-10-CM | POA: Insufficient documentation

## 2017-12-26 DIAGNOSIS — I110 Hypertensive heart disease with heart failure: Secondary | ICD-10-CM | POA: Diagnosis not present

## 2017-12-26 DIAGNOSIS — I447 Left bundle-branch block, unspecified: Secondary | ICD-10-CM | POA: Diagnosis not present

## 2017-12-26 DIAGNOSIS — G4733 Obstructive sleep apnea (adult) (pediatric): Secondary | ICD-10-CM | POA: Insufficient documentation

## 2017-12-26 DIAGNOSIS — G4730 Sleep apnea, unspecified: Secondary | ICD-10-CM | POA: Diagnosis not present

## 2017-12-26 DIAGNOSIS — R000 Tachycardia, unspecified: Secondary | ICD-10-CM | POA: Diagnosis not present

## 2017-12-26 DIAGNOSIS — Z79899 Other long term (current) drug therapy: Secondary | ICD-10-CM | POA: Insufficient documentation

## 2017-12-26 DIAGNOSIS — I5022 Chronic systolic (congestive) heart failure: Secondary | ICD-10-CM

## 2017-12-26 DIAGNOSIS — K219 Gastro-esophageal reflux disease without esophagitis: Secondary | ICD-10-CM | POA: Insufficient documentation

## 2017-12-26 DIAGNOSIS — E785 Hyperlipidemia, unspecified: Secondary | ICD-10-CM | POA: Diagnosis not present

## 2017-12-26 DIAGNOSIS — M109 Gout, unspecified: Secondary | ICD-10-CM | POA: Insufficient documentation

## 2017-12-26 DIAGNOSIS — I428 Other cardiomyopathies: Secondary | ICD-10-CM | POA: Diagnosis not present

## 2017-12-26 DIAGNOSIS — I5020 Unspecified systolic (congestive) heart failure: Secondary | ICD-10-CM | POA: Diagnosis present

## 2017-12-26 DIAGNOSIS — I429 Cardiomyopathy, unspecified: Secondary | ICD-10-CM | POA: Diagnosis not present

## 2017-12-26 DIAGNOSIS — Z95810 Presence of automatic (implantable) cardiac defibrillator: Secondary | ICD-10-CM | POA: Insufficient documentation

## 2017-12-26 LAB — CUP PACEART INCLINIC DEVICE CHECK
Battery Voltage: 3.12 V
Brady Statistic AP VP Percent: 0.1 %
Brady Statistic AS VP Percent: 98.77 %
Brady Statistic AS VS Percent: 1.12 %
Brady Statistic RA Percent Paced: 0.11 %
Brady Statistic RV Percent Paced: 41.71 %
Date Time Interrogation Session: 20190401163628
HIGH POWER IMPEDANCE MEASURED VALUE: 66 Ohm
Implantable Lead Implant Date: 20190318
Implantable Lead Implant Date: 20190318
Implantable Lead Location: 753860
Implantable Lead Serial Number: 442838
Implantable Pulse Generator Implant Date: 20190318
Lead Channel Impedance Value: 241.412
Lead Channel Impedance Value: 247.704
Lead Channel Impedance Value: 260.571
Lead Channel Impedance Value: 418 Ohm
Lead Channel Impedance Value: 418 Ohm
Lead Channel Impedance Value: 456 Ohm
Lead Channel Impedance Value: 722 Ohm
Lead Channel Impedance Value: 779 Ohm
Lead Channel Impedance Value: 836 Ohm
Lead Channel Impedance Value: 931 Ohm
Lead Channel Pacing Threshold Amplitude: 2.5 V
Lead Channel Pacing Threshold Pulse Width: 0.4 ms
Lead Channel Sensing Intrinsic Amplitude: 16 mV
Lead Channel Sensing Intrinsic Amplitude: 16.375 mV
Lead Channel Setting Pacing Amplitude: 3.5 V
Lead Channel Setting Pacing Amplitude: 3.5 V
Lead Channel Setting Pacing Amplitude: 5 V
Lead Channel Setting Pacing Pulse Width: 0.4 ms
Lead Channel Setting Pacing Pulse Width: 0.4 ms
MDC IDC LEAD IMPLANT DT: 20190318
MDC IDC LEAD LOCATION: 753858
MDC IDC LEAD LOCATION: 753859
MDC IDC MSMT BATTERY REMAINING LONGEVITY: 65 mo
MDC IDC MSMT LEADCHNL LV IMPEDANCE VALUE: 218.087
MDC IDC MSMT LEADCHNL LV IMPEDANCE VALUE: 278.237
MDC IDC MSMT LEADCHNL LV IMPEDANCE VALUE: 418 Ohm
MDC IDC MSMT LEADCHNL LV IMPEDANCE VALUE: 456 Ohm
MDC IDC MSMT LEADCHNL LV IMPEDANCE VALUE: 513 Ohm
MDC IDC MSMT LEADCHNL LV IMPEDANCE VALUE: 608 Ohm
MDC IDC MSMT LEADCHNL LV IMPEDANCE VALUE: 893 Ohm
MDC IDC MSMT LEADCHNL LV IMPEDANCE VALUE: 988 Ohm
MDC IDC MSMT LEADCHNL LV PACING THRESHOLD PULSEWIDTH: 0.4 ms
MDC IDC MSMT LEADCHNL RA PACING THRESHOLD AMPLITUDE: 0.375 V
MDC IDC MSMT LEADCHNL RA SENSING INTR AMPL: 3.875 mV
MDC IDC MSMT LEADCHNL RA SENSING INTR AMPL: 4.75 mV
MDC IDC MSMT LEADCHNL RV PACING THRESHOLD AMPLITUDE: 0.75 V
MDC IDC MSMT LEADCHNL RV PACING THRESHOLD PULSEWIDTH: 0.4 ms
MDC IDC SET LEADCHNL RV SENSING SENSITIVITY: 0.3 mV
MDC IDC STAT BRADY AP VS PERCENT: 0.01 %

## 2017-12-26 MED ORDER — CARVEDILOL 6.25 MG PO TABS: 9.3750 mg | ORAL_TABLET | Freq: Two times a day (BID) | ORAL | 6 refills | Status: DC

## 2017-12-26 NOTE — Patient Instructions (Signed)
Increase Carvedilol to 9.375 mg (1 & 1/2 tabs) Twice daily   Your physician recommends that you schedule a follow-up appointment in: 1-2 months

## 2017-12-26 NOTE — Progress Notes (Signed)
Wound check appointment. Dermabond removed. Wound without redness or edema. Incision edges approximated, wound well healed. Normal device function. Thresholds, sensing, and impedances consistent with implant measurements. Device programmed at 3.5V/5V for extra safety margin until 3 month visit. Histogram distribution appropriate for patient and level of activity. No mode switches or ventricular arrhythmias noted. Patient educated about wound care, arm mobility, lifting restrictions, shock plan. ROV with SK 6/18

## 2017-12-26 NOTE — Progress Notes (Signed)
[facility] Consult Note   Referring Physician: Dr. Brigitte Pulse Primary Care: Dr. Brigitte Pulse Primary Cardiologist: New to Dr. Haroldine Laws   HPI: Joseph Parrish is a 52 year old male with a past medical history of HTN, gout. Newly diagnosed with systolic CHF in June 6967 with EF 25%. He is referred by Dr. Brigitte Pulse for further management of HF.    He was seen in the ED in 10/2016 with abdominal pain, found to have acute cholecystitis. S/p lap chole.   He was seen in Dr. Raul Del office for regular follow up and noted to have a LBBB on EKG. He was then referred for an Echo. EF was reduced at 25% and referred to the HF Clinic for evaluation.  Underwent cath in 7/18 which showed normal coronary arteries. EF 40% We ordered cMRI to look for infiltrative or orther processes but this was denied. Also had sleep study with AHI ~ 5.   - cMRI 11/18 EF 26% no LGE  Underwent [employer] CRT-D implant on 12/12/17 with Dr. Bosie Helper.   Returns for post-implant f/u. Feels fine though has not been very active since ICD. HR has been fast and Dr. Caryl Comes increased carvedilol to 6.25 bid. Denies SOB, edema, orthopnea or PND. ICD healing well.    LHC 7/18   Normal coronary arteries EF ~40% by V-gram   Past Medical History:  Diagnosis Date  . Allergy   . CHF (congestive heart failure) (HCC)    cardiomyopathy  . Cholecystitis   . Cholelithiasis   . Diverticulosis   . GERD (gastroesophageal reflux disease)    diet   . Gout   . Headache    migraines  . History of kidney stones feb 2018 passed   . Hypertension   . Sleep apnea    new dx sept 2018 looking at dental appliances for mild osa    Current Outpatient Medications  Medication Sig Dispense Refill  . acetaminophen (TYLENOL) 500 MG tablet Take 500-1,000 mg by mouth every 6 (six) hours as needed for moderate pain or headache.     Marland Kitchen atovaquone-proguanil (MALARONE) 250-100 MG TABS tablet Take 1 tablet by mouth daily.  0  . carvedilol (COREG) 6.25  MG tablet Take 1 tablet (6.25 mg total) by mouth 2 (two) times daily. 60 tablet 6  . colchicine 0.6 MG tablet Take 0.6 mg daily as needed by mouth (for gout flares).    Marland Kitchen ibuprofen (ADVIL,MOTRIN) 200 MG tablet Take 600 mg by mouth every 8 (eight) hours as needed for headache or moderate pain.     Marland Kitchen loratadine (CLARITIN) 10 MG tablet Take 10 mg by mouth at bedtime.     Marland Kitchen losartan (COZAAR) 100 MG tablet Take 1 tablet (100 mg total) daily by mouth. 90 tablet 3  . protriptyline (VIVACTIL) 10 MG tablet Take 10 mg by mouth at bedtime.  3   No current facility-administered medications for this encounter.     No Active Allergies    Social History   Socioeconomic History  . Marital status: Single    Spouse name: Not on file  . Number of children: Not on file  . Years of education: Not on file  . Highest education level: Not on file  Occupational History  . Not on file  Social Needs  . Financial resource strain: Not on file  . Food insecurity:    Worry: Not on file    Inability: Not on file  . Transportation needs:    Medical:  Not on file    Non-medical: Not on file  Tobacco Use  . Smoking status: Never Smoker  . Smokeless tobacco: Never Used  Substance and Sexual Activity  . Alcohol use: Yes    Alcohol/week: 0.0 oz    Comment: socially  . Drug use: No  . Sexual activity: Not on file  Lifestyle  . Physical activity:    Days per week: 3 days    Minutes per session: 30 min  . Stress: Not at all  Relationships  . Social connections:    Talks on phone: Patient refused    Gets together: Patient refused    Attends religious service: Patient refused    Active member of club or organization: Patient refused    Attends meetings of clubs or organizations: Patient refused    Relationship status: Patient refused  . Intimate partner violence:    Fear of current or ex partner: Patient refused    Emotionally abused: Patient refused    Physically abused: Patient refused    Forced  sexual activity: Patient refused  Other Topics Concern  . Not on file  Social History Narrative  . Not on file      Family History  Problem Relation Age of Onset  . Aneurysm Father 42  . Stroke Paternal Grandfather   . Colon cancer Neg Hx   . Colon polyps Neg Hx   . Esophageal cancer Neg Hx   . Rectal cancer Neg Hx   . Stomach cancer Neg Hx     There were no vitals filed for this visit.   PHYSICAL EXAM: General:  Well appearing. No resp difficulty HEENT: normal Neck: supple. no JVD. Carotids 2+ bilat; no bruits. No lymphadenopathy or thryomegaly appreciated. Cor: PMI nondisplaced. Tachy regular  No rubs, gallops or murmurs. ICD site healing well  Lungs: clear Abdomen: soft, nontender, nondistended. No hepatosplenomegaly. No bruits or masses. Good bowel sounds. Extremities: no cyanosis, clubbing, rash, edema Neuro: alert & orientedx3, cranial nerves grossly intact. moves all 4 extremities w/o difficulty. Affect pleasant  ECG: Sinus tach 110, LBBB 177ms Personally reviewed     ASSESSMENT & PLAN: 1. Chronic systolic CHF:  - NICM with EF 35-40% on previous echo and cath. - cMRI 11/18 EF 26% no LGE - s/p [employer] CRT-D placement 3/19. QRS 153ms->122ms portends good respone - Continue losartan to 100mg  daily. Can consiaer switch to [place] in future - Remains tachycardia. Increase carvedilol to 9.375 bid. Can consider ivabradine as needed.  - Volume status looks good.  - Repeat echo 2 months  2. LBBB - Likely the cause of his cardiomyopathy.  - s/p CRT-D 3/19  3. HLD - Follows with Dr. Brigitte Pulse.   Glori Bickers, MD 12/26/17

## 2017-12-27 DIAGNOSIS — Z23 Encounter for immunization: Secondary | ICD-10-CM | POA: Diagnosis not present

## 2017-12-27 DIAGNOSIS — Z0000 Encounter for general adult medical examination without abnormal findings: Secondary | ICD-10-CM | POA: Diagnosis not present

## 2017-12-27 DIAGNOSIS — I10 Essential (primary) hypertension: Secondary | ICD-10-CM | POA: Diagnosis not present

## 2017-12-27 DIAGNOSIS — R7301 Impaired fasting glucose: Secondary | ICD-10-CM | POA: Diagnosis not present

## 2017-12-27 DIAGNOSIS — E7849 Other hyperlipidemia: Secondary | ICD-10-CM | POA: Diagnosis not present

## 2017-12-27 DIAGNOSIS — E781 Pure hyperglyceridemia: Secondary | ICD-10-CM | POA: Diagnosis not present

## 2018-01-05 ENCOUNTER — Encounter: Payer: Self-pay | Admitting: Internal Medicine

## 2018-01-05 NOTE — Progress Notes (Signed)
Joseph Parrish [id number]  [id number]  Preop Dx: NICM LBBB CHF class 2 Postop Dx same/   Procedure:ICD implant with LV lead placement  Cx: None    Procedure: dual  chamber ICD implantation with LV lead placement  Following the obtaining of informed consent the patient was brought to the electrophysiology laboratory in place of the fluoroscopic table in the supine position.  After routine prep and drape, lidocaine was infiltrated in the prepectoral subclavicular region and an incision was made and carried down to the layer of the prepectoral fascia using electrocautery and sharp dissection. A pocket was formed similarly.  Thereafter  attention was turned to gaining access to the extrathoracic left subclavian vein which was accomplished without  difficulty and without the aspiration of air or puncture of the artery.Three separate venipunctures were accomplished  Sequentially  A 9 French sheath  And 65F sheath and 9.5 Sheath were placed through which were   passed a [employer]   single coil   active fixation defibrillator lead, model [id number] serial number [id number]  a Medtronic MRI compatible  active fixation atrial lead, serial number [id number] and a St Jude 135 CS cannulation sheath.   St Jude 8502D  LV lead [id number] .  They were   passed under fluoroscopic guidance to the right ventricular apex and R atrial appendage  respectively.    In its location the bipolar measurements were recorded and appropriate.  There was no diaphragmatic pacing at 10 V. The current of injury was brisk.  The bipolar P measurements were also appropriate without diaphragmatic pacing at 10 V. The current of injury was brisk   The LV lead was placed with long Q-LV and and without diaphragmatic stimulation    The leads were secured to the prepectoral fascia and then attached to a Medtronic MRI compatible  ICD, serial number  [id number]       The pocket was copiously irrigated with antibiotic containing saline  solution. Hemostasis was assured, and the device and the leads were placed in the pocket and secured to the prepectoral fascia.  The wound was closed in 2 layers in normal fashion. The wound was washed dried and a dermabond dressing was  applied. Needle counts, sponge counts and instrument counts were correct at the end of the procedure according to the staff.      Virl Axe, MD 01/05/2018 5:50 PM

## 2018-01-27 ENCOUNTER — Other Ambulatory Visit (HOSPITAL_COMMUNITY): Payer: Self-pay | Admitting: *Deleted

## 2018-01-27 MED ORDER — CARVEDILOL 6.25 MG PO TABS: 6.2500 mg | ORAL_TABLET | Freq: Two times a day (BID) | ORAL | 3 refills | Status: DC

## 2018-01-27 MED ORDER — CARVEDILOL 3.125 MG PO TABS: 3.1250 mg | ORAL_TABLET | Freq: Two times a day (BID) | ORAL | 3 refills | Status: DC

## 2018-02-10 ENCOUNTER — Ambulatory Visit (HOSPITAL_COMMUNITY)
Admission: RE | Admit: 2018-02-10 | Discharge: 2018-02-10 | Disposition: A | Discharge status: Home / Self Care | Payer: BLUE CROSS/BLUE SHIELD | Source: Ambulatory Visit | Attending: Internal Medicine | Admitting: Internal Medicine

## 2018-02-10 ENCOUNTER — Encounter (HOSPITAL_COMMUNITY): Payer: Self-pay | Admitting: Internal Medicine

## 2018-02-10 VITALS — BP 132/78 | HR 97 | Wt 198.8 lb

## 2018-02-10 DIAGNOSIS — E785 Hyperlipidemia, unspecified: Secondary | ICD-10-CM | POA: Diagnosis not present

## 2018-02-10 DIAGNOSIS — I447 Left bundle-branch block, unspecified: Secondary | ICD-10-CM

## 2018-02-10 DIAGNOSIS — I5022 Chronic systolic (congestive) heart failure: Secondary | ICD-10-CM

## 2018-02-10 DIAGNOSIS — Z79899 Other long term (current) drug therapy: Secondary | ICD-10-CM | POA: Diagnosis not present

## 2018-02-10 DIAGNOSIS — K219 Gastro-esophageal reflux disease without esophagitis: Secondary | ICD-10-CM | POA: Diagnosis not present

## 2018-02-10 DIAGNOSIS — I110 Hypertensive heart disease with heart failure: Secondary | ICD-10-CM | POA: Insufficient documentation

## 2018-02-10 MED ORDER — CARVEDILOL 12.5 MG PO TABS: 12.5000 mg | ORAL_TABLET | Freq: Two times a day (BID) | ORAL | 3 refills | Status: DC

## 2018-02-10 NOTE — Patient Instructions (Signed)
INCREASE Carvedilol to 12.5 mg Twice Daily  Echocardiogram has been ordered for you, we will schedule at checkout.  Follow up in 2 Months.

## 2018-02-10 NOTE — Progress Notes (Signed)
Advanced [facility] Failure Clinic Consult Note   Referring Physician: Dr. Brigitte Parrish Primary Care: Dr. Brigitte Parrish Primary Cardiologist: New to Dr. Haroldine Parrish   HPI: [name] is a 52 year old male with a past medical history of HTN, gout. Newly diagnosed with systolic CHF in June 1696 with EF 25%. He is referred by Dr. Brigitte Parrish for further management of HF.    He was seen in the ED in 10/2016 with abdominal pain, found to have acute cholecystitis. S/p lap chole.   He was seen in Dr. Raul Parrish office for regular follow up and noted to have a LBBB on EKG. He was then referred for an Echo. EF was reduced at 25% and referred to the HF Clinic for evaluation.  Underwent cath in 7/18 which showed normal coronary arteries. EF 40% We ordered cMRI to look for infiltrative or orther processes but this was denied. Also had sleep study with AHI ~ 5.   - cMRI 11/18 EF 26% no LGE  Underwent [employer] CRT-D implant on 12/12/17 with Dr. Bosie Parrish.   Returns for f/u. Feels good. Still exercising with F3. Hasn't really noticed a benefit from the device but wasn't very symptomatic before. No edema, orthopnea or PND. Site completely healed. Resting HR rates 66-95 on iPhone   LHC 7/18   Normal coronary arteries EF ~40% by V-gram   Past Medical History:  Diagnosis Date  . Allergy   . CHF (congestive heart failure) (HCC)    cardiomyopathy  . Cholecystitis   . Cholelithiasis   . Diverticulosis   . GERD (gastroesophageal reflux disease)    diet   . Gout   . Headache    migraines  . History of kidney stones feb 2018 passed   . Hypertension   . Sleep apnea    new dx sept 2018 looking at dental appliances for mild osa    Current Outpatient Medications  Medication Sig Dispense Refill  . acetaminophen (TYLENOL) 500 MG tablet Take 500-1,000 mg by mouth every 6 (six) hours as needed for moderate pain or headache.     Marland Kitchen atovaquone-proguanil (MALARONE) 250-100 MG TABS tablet Take 1 tablet by mouth daily.  0  .  carvedilol (COREG) 6.25 MG tablet Take 9.375 mg by mouth 2 (two) times daily with a meal.    . colchicine 0.6 MG tablet Take 0.6 mg daily as needed by mouth (for gout flares).    Marland Kitchen ibuprofen (ADVIL,MOTRIN) 200 MG tablet Take 600 mg by mouth every 8 (eight) hours as needed for headache or moderate pain.     Marland Kitchen loratadine (CLARITIN) 10 MG tablet Take 10 mg by mouth at bedtime.     Marland Kitchen losartan (COZAAR) 100 MG tablet Take 1 tablet (100 mg total) daily by mouth. 90 tablet 3  . protriptyline (VIVACTIL) 10 MG tablet Take 10 mg by mouth at bedtime.  3   No current facility-administered medications for this encounter.     No Active Allergies    Social History   Socioeconomic History  . Marital status: Single    Spouse name: Not on file  . Number of children: Not on file  . Years of education: Not on file  . Highest education level: Not on file  Occupational History  . Not on file  Social Needs  . Financial resource strain: Not on file  . Food insecurity:    Worry: Not on file    Inability: Not on file  . Transportation needs:    Medical: Not  on file    Non-medical: Not on file  Tobacco Use  . Smoking status: Never Smoker  . Smokeless tobacco: Never Used  Substance and Sexual Activity  . Alcohol use: Yes    Alcohol/week: 0.0 oz    Comment: socially  . Drug use: No  . Sexual activity: Not on file  Lifestyle  . Physical activity:    Days per week: 3 days    Minutes per session: 30 min  . Stress: Not at all  Relationships  . Social connections:    Talks on phone: Patient refused    Gets together: Patient refused    Attends religious service: Patient refused    Active member of club or organization: Patient refused    Attends meetings of clubs or organizations: Patient refused    Relationship status: Patient refused  . Intimate partner violence:    Fear of current or ex partner: Patient refused    Emotionally abused: Patient refused    Physically abused: Patient refused     Forced sexual activity: Patient refused  Other Topics Concern  . Not on file  Social History Narrative  . Not on file      Family History  Problem Relation Age of Onset  . Aneurysm Father 66  . Stroke Paternal Grandfather   . Colon cancer Neg Hx   . Colon polyps Neg Hx   . Esophageal cancer Neg Hx   . Rectal cancer Neg Hx   . Stomach cancer Neg Hx     Vitals:   02/10/18 1359  BP: 132/78  Parrish: 97  SpO2: 96%  Weight: 198 lb 12.8 oz (90.2 kg)     PHYSICAL EXAM: General:  Well appearing. No resp difficulty HEENT: normal Neck: supple. no JVD. Carotids 2+ bilat; no bruits. No lymphadenopathy or thryomegaly appreciated. Cor: PMI nondisplaced. Regular rate & rhythm. No rubs, gallops or murmurs. IC site completely healed Lungs: clear Abdomen: soft, nontender, nondistended. No hepatosplenomegaly. No bruits or masses. Good bowel sounds. Extremities: no cyanosis, clubbing, rash, edema Neuro: alert & orientedx3, cranial nerves grossly intact. moves all 4 extremities w/o difficulty. Affect pleasant  ECG NSR with v-pacing QR 170ms. Personally reviewed  ASSESSMENT & PLAN: 1. Chronic systolic CHF:  - NICM with EF 35-40% on previous echo and cath. - cMRI 11/18 EF 26% no LGE - s/p BSci CRT-D placement 3/19. QRS 122ms->122ms portends good respone - Has been tachycardic but eesting HR on iPhone ranging 65-90 - Continue losartan to 100mg  daily. Can consider switch to [facility] in future - Increase carvedilol to 12.5mg  bid - Volume status looks good.  - Repeat echo 2 months - ICD interrogation - volume status looks good. No VT/AFActivity 4hr/day 100% Biv pacing Personally reviewed  2. LBBB - Likely the cause of his cardiomyopathy.  - s/p [employer] CRT-D 3/19  3. HLD - Follows with Dr. Brigitte Parrish.   Glori Bickers, MD 02/10/18

## 2018-02-11 IMAGING — MR MR CARD MORPHOLOGY WO/W CM
9 of 11 series · 36 of 40 positions shown · IV contrast (multihance)
Comparison: none

CLINICAL DATA: Cardiomyopathy of uncertain etiology.

EXAM:
CARDIAC MRI
TECHNIQUE: The patient was scanned on a 1.5 Tesla GE magnet. A dedicated
cardiac coil was used. Functional imaging was done using Fiesta
sequences. [DATE], and 4 chamber views were done to assess for RWMA's.
Modified Angsar rule using a short axis stack was used to
calculate an ejection fraction on a dedicated work station using
Circle software. The patient received 30 cc of Multihance. After 10
minutes inversion recovery sequences were used to assess for
infiltration and scar tissue.
CONTRAST:  30 cc Multihance contrast.

[Series 3: bSSFP · sagittal · 8.0mm · 1.45mm/px · 1 of 14 slices shown (1 of 4)]
[im 1/14]
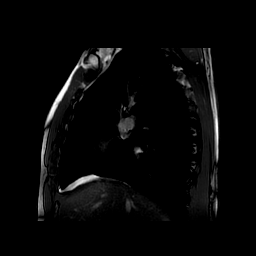

[Series 4: bSSFP · axial · 8.0mm · 1.37mm/px · 1 of 20 slices shown (2 of 4)]
[im 1/20]
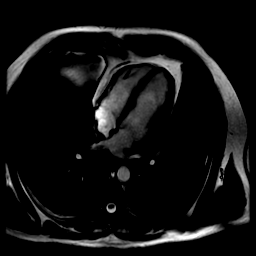

[Series 6: bSSFP · oblique · 8.0mm · 1.25mm/px · 20 of 320 slices shown (3 of 4)]
[im 1/320]
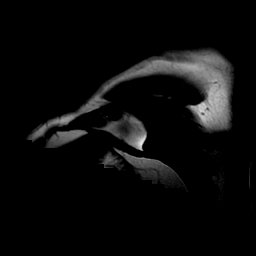
[im 17/320]
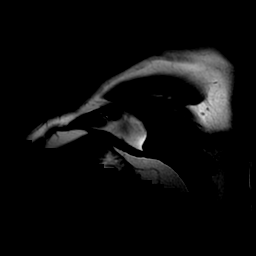
[im 34/320]
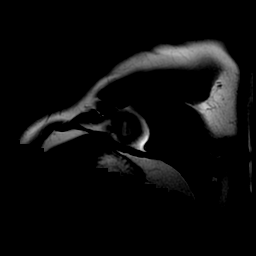
[im 51/320]
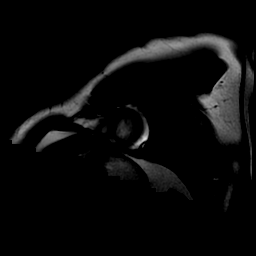
[im 68/320]
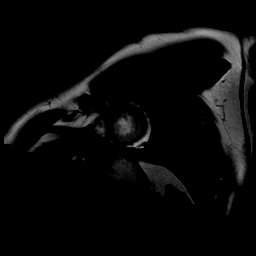
[im 84/320]
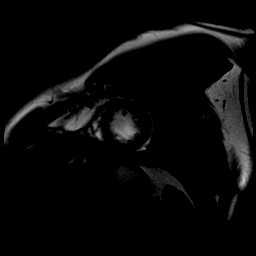
[im 101/320]
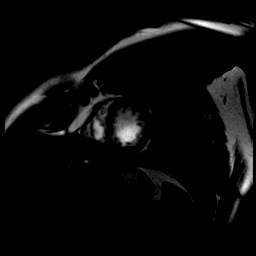
[im 118/320]
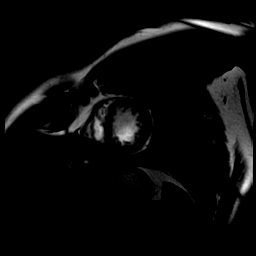
[im 135/320]
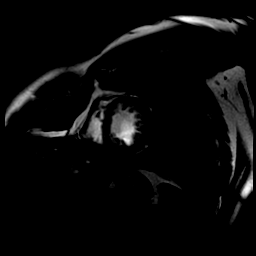
[im 152/320]
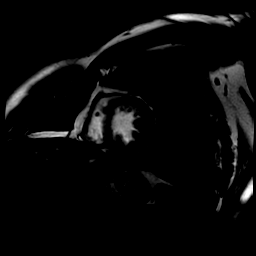
[im 168/320]
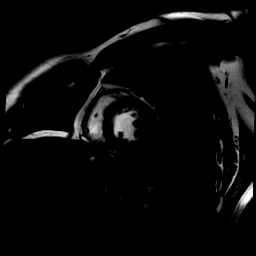
[im 185/320]
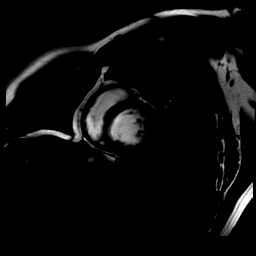
[im 202/320]
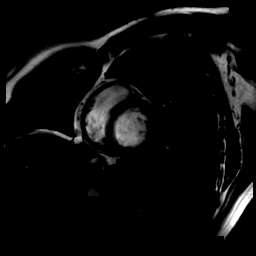
[im 219/320]
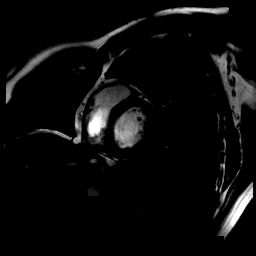
[im 236/320]
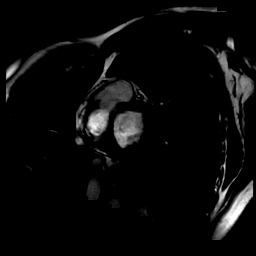
[im 252/320]
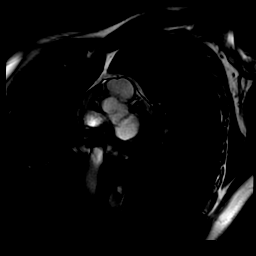
[im 269/320]
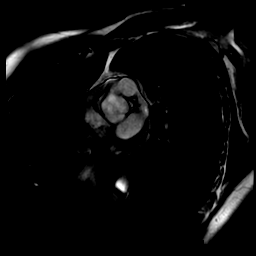
[im 286/320]
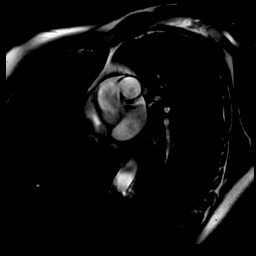
[im 303/320]
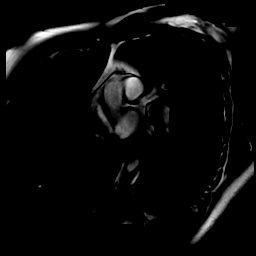
[im 320/320]
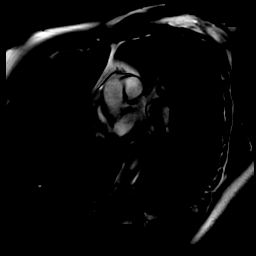

[Series 7: T1 · oblique · 8.0mm · 1.25mm/px · 1 of 16 slices shown]
[im 1/16]
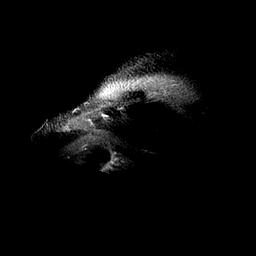

[Series 10: qpqs (id) · axial · 8.0mm · 1.48mm/px · z∈[-60,-60]mm · 3 of 60 slices shown]
[im 1/60]
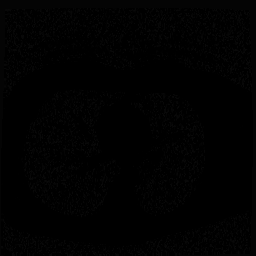
[im 30/60]
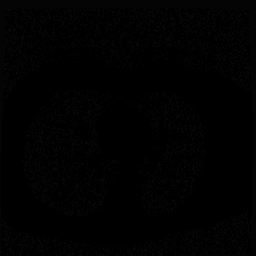
[im 60/60]
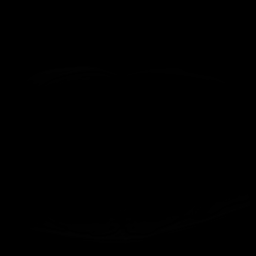

[Series 13: bSSFP · oblique · 8.0mm · 1.37mm/px · 4 of 60 slices shown (4 of 4)]
[im 1/60]
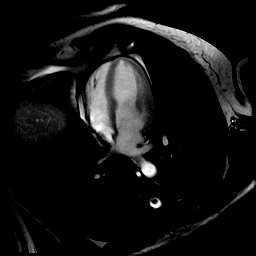
[im 20/60]
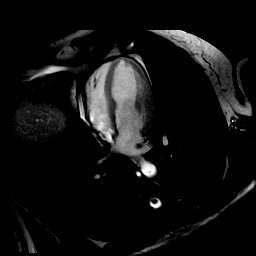
[im 40/60]
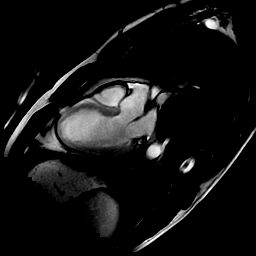
[im 60/60]
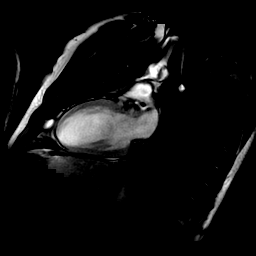

[Series 14: tags grid sa · oblique · 8.0mm · 1.25mm/px · 4 of 60 slices shown]
[im 1/60]
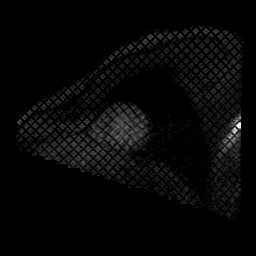
[im 20/60]
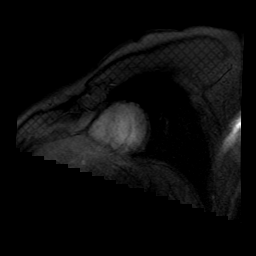
[im 40/60]
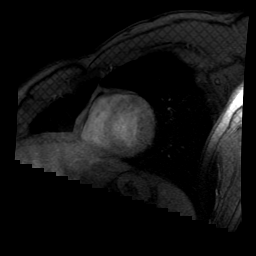
[im 60/60]
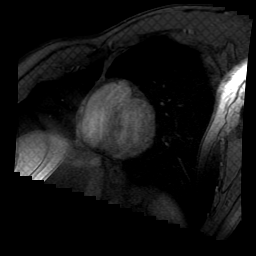

[Series 17: delayed ir prep · oblique · 8.0mm · 1.41mm/px · 1 of 11 slices shown]
[im 1/11]
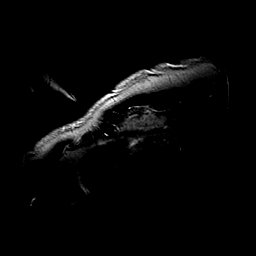

[Series 20: rad mde · axial · 8.0mm · 1.37mm/px · 1 of 3 slices shown]
[im 1/3]
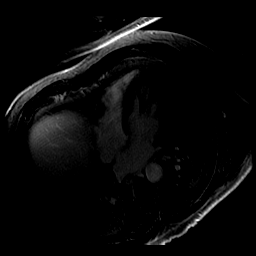

[36 of 40 positions shown; findings below may reference images not displayed]

FINDINGS: Limited images of the lung fields showed no significant
abnormalities.

Normal left ventricular size and wall thickness. There was marked
septal-lateral dyssynchrony with severe hypokinesis of the septal
wall. EF 26%. Normal right ventricular size with moderate systolic
dysfunction, EF 30%. Normal left and right atrial sizes. Trileaflet
aortic valve with no significant regurgitation or stenosis. No
significant mitral regurgitation.

Delayed enhancement imaging showed no myocardial late gadolinium
enhancement (LGE).

MEASUREMENTS:
MEASUREMENTS
LVEDV 150 mL

LV SV 40 mL

LV EF 26%

RVEDV 109 mL

RV SV 32 mL

RV EF 30%
IMPRESSION: 1. Normal LV size with marked septal-lateral dyssynchrony. The
septum is severely hypokinetic. EF 26% (difficult calculation given
dyssynchrony). Would consider LBBB cardiomyopathy.

2. Normal RV size with moderately decreased systolic function, EF
30%.

3. No myocardial LGE, so no definitive evidence for prior MI,
infiltrative disease, or myocarditis.

Arijanit Piber

## 2018-03-07 ENCOUNTER — Encounter: Payer: BLUE CROSS/BLUE SHIELD | Admitting: Internal Medicine

## 2018-03-10 ENCOUNTER — Ambulatory Visit (HOSPITAL_COMMUNITY)
Admission: RE | Admit: 2018-03-10 | Discharge: 2018-03-10 | Disposition: A | Discharge status: Home / Self Care | Payer: BLUE CROSS/BLUE SHIELD | Source: Ambulatory Visit | Attending: Internal Medicine | Admitting: Internal Medicine

## 2018-03-10 DIAGNOSIS — I5022 Chronic systolic (congestive) heart failure: Secondary | ICD-10-CM | POA: Diagnosis not present

## 2018-03-10 DIAGNOSIS — I110 Hypertensive heart disease with heart failure: Secondary | ICD-10-CM | POA: Diagnosis not present

## 2018-03-10 NOTE — Progress Notes (Signed)
  Echocardiogram 2D Echocardiogram has been performed.  Joseph Parrish T Joseph Parrish 03/10/2018, 8:33 AM

## 2018-03-14 ENCOUNTER — Encounter: Payer: BLUE CROSS/BLUE SHIELD | Admitting: Internal Medicine

## 2018-03-21 ENCOUNTER — Ambulatory Visit: Payer: BLUE CROSS/BLUE SHIELD | Admitting: Internal Medicine

## 2018-03-21 VITALS — BP 132/98 | HR 87 | Resp 14 | Ht 68.0 in | Wt 198.6 lb

## 2018-03-21 DIAGNOSIS — I428 Other cardiomyopathies: Secondary | ICD-10-CM

## 2018-03-21 DIAGNOSIS — I447 Left bundle-branch block, unspecified: Secondary | ICD-10-CM

## 2018-03-21 DIAGNOSIS — I5022 Chronic systolic (congestive) heart failure: Secondary | ICD-10-CM | POA: Diagnosis not present

## 2018-03-21 NOTE — Patient Instructions (Signed)
Medication Instructions:  Your physician recommends that you continue on your current medications as directed. Please refer to the Current Medication list given to you today.  Labwork: None ordered.  Testing/Procedures: None ordered.  Follow-Up: Your physician wants you to follow-up in: 9 months with Dr Caryl Comes. You will receive a reminder letter in the mail two months in advance. If you don't receive a letter, please call our office to schedule the follow-up appointment.  Remote monitoring is used to monitor your Pacemaker of ICD from home. This monitoring reduces the number of office visits required to check your device to one time per year. It allows [place] to keep an eye on the functioning of your device to ensure it is working properly. You are scheduled for a device check from home on 06/20/2018. You may send your transmission at any time that day. If you have a wireless device, the transmission will be sent automatically. After your physician reviews your transmission, you will receive a postcard with your next transmission date.    Any Other Special Instructions Will Be Listed Below (If Applicable).     If you need a refill on your cardiac medications before your next appointment, please call your pharmacy.

## 2018-03-21 NOTE — Progress Notes (Signed)
Patient Care Team: Marton Redwood, MD as PCP - General (Internal Medicine)   HPI  Joseph Parrish is a 52 y.o. male Seen in follow-up for CRT-D for nonischemic cardiomyopathy and left bundle branch block.  The patient denies chest pain, shortness of breath, nocturnal dyspnea, orthopnea or peripheral edema.  There have been no palpitations, lightheadedness or syncope.    DATE TEST EF   6/18 Echo   25 %   7/18 Cath    40 % Normal CA  11/18 cMRI  26 LGE neg  6/19 Echo  45-50%    Other Records and Results Reviewed   Past Medical History:  Diagnosis Date  . Allergy   . CHF (congestive heart failure) (HCC)    cardiomyopathy  . Cholecystitis   . Cholelithiasis   . Diverticulosis   . GERD (gastroesophageal reflux disease)    diet   . Gout   . Headache    migraines  . History of kidney stones feb 2018 passed   . Hypertension   . Sleep apnea    new dx sept 2018 looking at dental appliances for mild osa    Past Surgical History:  Procedure Laterality Date  . BIV ICD INSERTION CRT-D N/A 12/12/2017   Procedure: BIV ICD INSERTION CRT-D;  Surgeon: Deboraha Sprang, MD;  Location: [facility];  Service: Cardiovascular;  Laterality: N/A;  . CHOLECYSTECTOMY  01/2017  . COLONOSCOPY WITH PROPOFOL N/A 07/26/2017   Procedure: COLONOSCOPY WITH PROPOFOL;  Surgeon: Jerene Bears, MD;  Location: [facility];  Service: Gastroenterology;  Laterality: N/A;  . LEFT HEART CATH AND CORONARY ANGIOGRAPHY N/A 04/08/2017   Procedure: Left Heart Cath and Coronary Angiography;  Surgeon: Jolaine Artist, MD;  Location: [facility];  Service: Cardiovascular;  Laterality: N/A;  . TONSILLECTOMY AND ADENOIDECTOMY      Current Meds  Medication Sig  . acetaminophen (TYLENOL) 500 MG tablet Take 500-1,000 mg by mouth every 6 (six) hours as needed for moderate pain or headache.   . carvedilol (COREG) 12.5 MG tablet Take 1 tablet (12.5 mg total) by mouth 2 (two) times daily with a  meal.  . colchicine 0.6 MG tablet Take 0.6 mg daily as needed by mouth (for gout flares).  Marland Kitchen ibuprofen (ADVIL,MOTRIN) 200 MG tablet Take 600 mg by mouth every 8 (eight) hours as needed for headache or moderate pain.   Marland Kitchen loratadine (CLARITIN) 10 MG tablet Take 10 mg by mouth at bedtime.   Marland Kitchen losartan (COZAAR) 100 MG tablet Take 1 tablet (100 mg total) daily by mouth.  . protriptyline (VIVACTIL) 10 MG tablet Take 10 mg by mouth at bedtime.    No Known Allergies    Review of Systems negative except from HPI and PMH  Physical Exam BP (!) 132/98   Pulse 87   Resp 14   Ht 5\' 8"  (1.727 m)   Wt 198 lb 9.6 oz (90.1 kg)   SpO2 95%   BMI 30.20 kg/m  Well developed and well nourished in no acute distress HENT normal E scleral and icterus clear Neck Supple JVP flat; carotids brisk and full Clear to ausculation Device pocket well healed; without hematoma or erythema.  There is no tethering  Regular rate and rhythm, no murmurs gallops or rub Soft with active bowel sounds No clubbing cyanosis  Edema Alert and oriented, grossly normal motor and sensory function Skin Warm and Dry  ECG  Sinus w P-synchronous/ AV  pacing  QRS 134<<146  Assessment and  Plan  LBBB  NICM ' CRT -Medtronic   Euvolemic continue current meds  Will discuss with DR DB re alt meds   Current medicines are reviewed at length with the patient today .  The patient does not  have concerns regarding medicines.

## 2018-04-09 ENCOUNTER — Other Ambulatory Visit (HOSPITAL_COMMUNITY): Payer: Self-pay | Admitting: Internal Medicine

## 2018-04-11 LAB — CUP PACEART INCLINIC DEVICE CHECK
Battery Remaining Longevity: 90 mo
Battery Voltage: 3.06 V
Brady Statistic AS VS Percent: 1.33 %
Brady Statistic RA Percent Paced: 0.23 %
Date Time Interrogation Session: 20190625191752
HighPow Impedance: 68 Ohm
Implantable Lead Implant Date: 20190318
Implantable Lead Implant Date: 20190318
Implantable Lead Location: 753858
Implantable Lead Location: 753860
Implantable Lead Model: 293
Lead Channel Impedance Value: 1007 Ohm
Lead Channel Impedance Value: 1007 Ohm
Lead Channel Impedance Value: 283.733
Lead Channel Impedance Value: 289.049
Lead Channel Impedance Value: 418 Ohm
Lead Channel Impedance Value: 456 Ohm
Lead Channel Impedance Value: 532 Ohm
Lead Channel Impedance Value: 551 Ohm
Lead Channel Impedance Value: 589 Ohm
Lead Channel Impedance Value: 931 Ohm
Lead Channel Impedance Value: 950 Ohm
Lead Channel Pacing Threshold Amplitude: 0.5 V
Lead Channel Pacing Threshold Amplitude: 0.625 V
Lead Channel Pacing Threshold Amplitude: 2 V
Lead Channel Pacing Threshold Pulse Width: 0.4 ms
Lead Channel Pacing Threshold Pulse Width: 0.4 ms
Lead Channel Pacing Threshold Pulse Width: 0.4 ms
Lead Channel Sensing Intrinsic Amplitude: 17.125 mV
Lead Channel Sensing Intrinsic Amplitude: 4 mV
Lead Channel Sensing Intrinsic Amplitude: 4.75 mV
Lead Channel Setting Pacing Amplitude: 2.25 V
Lead Channel Setting Pacing Pulse Width: 0.4 ms
Lead Channel Setting Sensing Sensitivity: 0.3 mV
MDC IDC LEAD IMPLANT DT: 20190318
MDC IDC LEAD LOCATION: 753859
MDC IDC LEAD SERIAL: 442838
MDC IDC MSMT LEADCHNL LV IMPEDANCE VALUE: 1007 Ohm
MDC IDC MSMT LEADCHNL LV IMPEDANCE VALUE: 270.667
MDC IDC MSMT LEADCHNL LV IMPEDANCE VALUE: 279.525
MDC IDC MSMT LEADCHNL LV IMPEDANCE VALUE: 299.175
MDC IDC MSMT LEADCHNL LV IMPEDANCE VALUE: 608 Ohm
MDC IDC MSMT LEADCHNL LV IMPEDANCE VALUE: 950 Ohm
MDC IDC MSMT LEADCHNL RV IMPEDANCE VALUE: 418 Ohm
MDC IDC MSMT LEADCHNL RV SENSING INTR AMPL: 1.25 mV
MDC IDC PG IMPLANT DT: 20190318
MDC IDC SET LEADCHNL LV PACING AMPLITUDE: 2.5 V
MDC IDC SET LEADCHNL LV PACING PULSEWIDTH: 1 ms
MDC IDC SET LEADCHNL RV PACING AMPLITUDE: 2.5 V
MDC IDC STAT BRADY AP VP PERCENT: 0.22 %
MDC IDC STAT BRADY AP VS PERCENT: 0.01 %
MDC IDC STAT BRADY AS VP PERCENT: 98.44 %
MDC IDC STAT BRADY RV PERCENT PACED: 29.86 %

## 2018-04-20 ENCOUNTER — Encounter (HOSPITAL_COMMUNITY): Payer: Self-pay | Admitting: Internal Medicine

## 2018-04-20 ENCOUNTER — Other Ambulatory Visit: Payer: Self-pay

## 2018-04-20 ENCOUNTER — Ambulatory Visit (HOSPITAL_COMMUNITY)
Admission: RE | Admit: 2018-04-20 | Discharge: 2018-04-20 | Disposition: A | Discharge status: Home / Self Care | Payer: BLUE CROSS/BLUE SHIELD | Source: Ambulatory Visit | Attending: Internal Medicine | Admitting: Internal Medicine

## 2018-04-20 VITALS — BP 116/85 | HR 88 | Wt 195.5 lb

## 2018-04-20 DIAGNOSIS — M109 Gout, unspecified: Secondary | ICD-10-CM | POA: Insufficient documentation

## 2018-04-20 DIAGNOSIS — Z79899 Other long term (current) drug therapy: Secondary | ICD-10-CM | POA: Insufficient documentation

## 2018-04-20 DIAGNOSIS — I428 Other cardiomyopathies: Secondary | ICD-10-CM | POA: Diagnosis not present

## 2018-04-20 DIAGNOSIS — I429 Cardiomyopathy, unspecified: Secondary | ICD-10-CM | POA: Insufficient documentation

## 2018-04-20 DIAGNOSIS — I110 Hypertensive heart disease with heart failure: Secondary | ICD-10-CM | POA: Insufficient documentation

## 2018-04-20 DIAGNOSIS — I447 Left bundle-branch block, unspecified: Secondary | ICD-10-CM | POA: Diagnosis not present

## 2018-04-20 DIAGNOSIS — I5022 Chronic systolic (congestive) heart failure: Secondary | ICD-10-CM | POA: Insufficient documentation

## 2018-04-20 DIAGNOSIS — E785 Hyperlipidemia, unspecified: Secondary | ICD-10-CM | POA: Insufficient documentation

## 2018-04-20 DIAGNOSIS — K219 Gastro-esophageal reflux disease without esophagitis: Secondary | ICD-10-CM | POA: Diagnosis not present

## 2018-04-20 NOTE — Progress Notes (Signed)
Advanced Heart Failure Clinic Consult Note   Referring Physician: Dr. Brigitte Parrish Primary Care: Dr. Brigitte Parrish Primary Cardiologist: New to Dr. Haroldine Parrish   HPI: [name] is a 52 year old male with a past medical history of HTN, gout. Newly diagnosed with systolic CHF in June 2010 with EF 25%. He is referred by Dr. Brigitte Parrish for further management of HF.    He was seen in the ED in 10/2016 with abdominal pain, found to have acute cholecystitis. S/p lap chole.   He was seen in Dr. Raul Parrish office for regular follow up and noted to have a LBBB on EKG. He was then referred for an Echo. EF was reduced at 25% and referred to the HF Clinic for evaluation.  Underwent cath in 7/18 which showed normal coronary arteries. EF 40% We ordered cMRI to look for infiltrative or orther processes but this was denied. Also had sleep study with AHI ~ 5.   - cMRI 11/18 EF 26% no LGE  Underwent [employer] CRT-D implant on 12/12/17 with Dr. Bosie Parrish.   Returns today for HF follow up. Last visit coreg was increased. Overall doing very well. Denies SOB. Remains very active  - works out once or twice/week with [employer] group. Walks 3 miles with his wife. No orthopnea, PND, or edema. No CP or dizziness. Energy and appetite okay. Not weighing daily. Taking all medications. Does not limit fluid or salt intake. Resting HR improved from 100 to 80s.   LHC 7/18   Normal coronary arteries EF ~40% by V-gram   Past Medical History:  Diagnosis Date  . Allergy   . CHF (congestive heart failure) (HCC)    cardiomyopathy  . Cholecystitis   . Cholelithiasis   . Diverticulosis   . GERD (gastroesophageal reflux disease)    diet   . Gout   . Headache    migraines  . History of kidney stones feb 2018 passed   . Hypertension   . Sleep apnea    new dx sept 2018 looking at dental appliances for mild osa    Current Outpatient Medications  Medication Sig Dispense Refill  . acetaminophen (TYLENOL) 500 MG tablet Take 500-1,000 mg by mouth  every 6 (six) hours as needed for moderate pain or headache.     Marland Kitchen atovaquone-proguanil (MALARONE) 250-100 MG TABS tablet Take 1 tablet by mouth daily.  0  . carvedilol (COREG) 12.5 MG tablet Take 1 tablet (12.5 mg total) by mouth 2 (two) times daily with a meal. 60 tablet 3  . colchicine 0.6 MG tablet Take 0.6 mg daily as needed by mouth (for gout flares).    Marland Kitchen ibuprofen (ADVIL,MOTRIN) 200 MG tablet Take 600 mg by mouth every 8 (eight) hours as needed for headache or moderate pain.     Marland Kitchen loratadine (CLARITIN) 10 MG tablet Take 10 mg by mouth at bedtime.     Marland Kitchen losartan (COZAAR) 100 MG tablet Take 1 tablet (100 mg total) daily by mouth. 90 tablet 3  . protriptyline (VIVACTIL) 10 MG tablet Take 10 mg by mouth at bedtime.  3   No current facility-administered medications for this encounter.     No Known Allergies    Social History   Socioeconomic History  . Marital status: Single    Spouse name: Not on file  . Number of children: Not on file  . Years of education: Not on file  . Highest education level: Not on file  Occupational History  . Not on file  Social Needs  . Financial resource strain: Not on file  . Food insecurity:    Worry: Not on file    Inability: Not on file  . Transportation needs:    Medical: Not on file    Non-medical: Not on file  Tobacco Use  . Smoking status: Never Smoker  . Smokeless tobacco: Never Used  Substance and Sexual Activity  . Alcohol use: Yes    Alcohol/week: 0.0 oz    Comment: socially  . Drug use: No  . Sexual activity: Not on file  Lifestyle  . Physical activity:    Days per week: 3 days    Minutes per session: 30 min  . Stress: Not at all  Relationships  . Social connections:    Talks on phone: Patient refused    Gets together: Patient refused    Attends religious service: Patient refused    Active member of club or organization: Patient refused    Attends meetings of clubs or organizations: Patient refused    Relationship  status: Patient refused  . Intimate partner violence:    Fear of current or ex partner: Patient refused    Emotionally abused: Patient refused    Physically abused: Patient refused    Forced sexual activity: Patient refused  Other Topics Concern  . Not on file  Social History Narrative  . Not on file      Family History  Problem Relation Age of Onset  . Aneurysm Father 42  . Stroke Paternal Grandfather   . Colon cancer Neg Hx   . Colon polyps Neg Hx   . Esophageal cancer Neg Hx   . Rectal cancer Neg Hx   . Stomach cancer Neg Hx     Vitals:   04/20/18 1450  BP: 116/85  Parrish: 88  SpO2: 97%  Weight: 195 lb 8 oz (88.7 kg)     PHYSICAL EXAM: General:  Well appearing. No resp difficulty HEENT: normal Neck: supple. no JVD. Carotids 2+ bilat; no bruits. No lymphadenopathy or thryomegaly appreciated. Cor: PMI nondisplaced. Regular rate & rhythm. No rubs, gallops or murmurs. Lungs: clear Abdomen: soft, nontender, nondistended. No hepatosplenomegaly. No bruits or masses. Good bowel sounds. Extremities: no cyanosis, clubbing, rash, edema Neuro: alert & orientedx3, cranial nerves grossly intact. moves all 4 extremities w/o difficulty. Affect pleasant  ASSESSMENT & PLAN: 1. Chronic systolic CHF:  - NICM with EF 35-40% on previous echo and cath. - cMRI 11/18 EF 26% no LGE - s/p MDT CRT-D placement 3/19. QRS 178ms->122ms portends good respone - Echo 02/2018: EF 45-50%, grade 2 DD - NYHA class I - Volume status looks good.  - ICD interrogation - No VF/VF, Bivpacing close to 100%, active 4-5 hours daily, thoracic impedence up and down, but currently above threshold.  - Continue losartan to 100mg  daily. Can consider switch to [facility] in future - Continue carvedilol 12.5mg  bid - Hold off on spiro with last K 4.8  2. LBBB - Likely the cause of his cardiomyopathy.  - s/p MDT CRT-D 3/19 - Follows with Dr Joseph Parrish.  3. HLD - Follows with Dr. Brigitte Parrish.    Joseph Shore,  NP 04/20/18   Patient seen and examined with the above-signed Advanced Practice Provider and/or Housestaff. I personally reviewed laboratory data, imaging studies and relevant notes. I independently examined the patient and formulated the important aspects of the plan. I have edited the note to reflect any of my changes or salient points. I have personally discussed the plan  with the patient and/or family.  He is doing great. NYHA I. I reviewed recent echo with him and his wife. He has had very good response to CRT. EF now ~50% with reverse remodeling. No evidence of HF. Would continue current therapy. Will see back in 6 months.   Glori Bickers, MD  9:44 PM

## 2018-04-20 NOTE — Patient Instructions (Signed)
We will contact you in 6-9 months to schedule your next appointment.

## 2018-05-12 ENCOUNTER — Other Ambulatory Visit (HOSPITAL_COMMUNITY): Payer: Self-pay | Admitting: Internal Medicine

## 2018-06-20 ENCOUNTER — Ambulatory Visit (INDEPENDENT_AMBULATORY_CARE_PROVIDER_SITE_OTHER): Payer: BLUE CROSS/BLUE SHIELD | Admitting: *Deleted

## 2018-06-20 DIAGNOSIS — I428 Other cardiomyopathies: Secondary | ICD-10-CM | POA: Diagnosis not present

## 2018-06-20 NOTE — Progress Notes (Signed)
Remote ICD transmission.   

## 2018-06-21 ENCOUNTER — Encounter: Payer: Self-pay | Admitting: Cardiology

## 2018-06-28 LAB — CUP PACEART REMOTE DEVICE CHECK
Battery Remaining Longevity: 98 mo
Battery Voltage: 3.02 V
Brady Statistic AS VS Percent: 1.38 %
Brady Statistic RV Percent Paced: 22.79 %
Date Time Interrogation Session: 20190924084224
HighPow Impedance: 73 Ohm
Implantable Lead Implant Date: 20190318
Implantable Lead Implant Date: 20190318
Implantable Lead Location: 753858
Implantable Lead Location: 753860
Implantable Lead Model: 293
Implantable Lead Model: 5076
Implantable Pulse Generator Implant Date: 20190318
Lead Channel Impedance Value: 1007 Ohm
Lead Channel Impedance Value: 1007 Ohm
Lead Channel Impedance Value: 1026 Ohm
Lead Channel Impedance Value: 283.733
Lead Channel Impedance Value: 283.733
Lead Channel Impedance Value: 475 Ohm
Lead Channel Impedance Value: 513 Ohm
Lead Channel Impedance Value: 532 Ohm
Lead Channel Impedance Value: 532 Ohm
Lead Channel Pacing Threshold Amplitude: 0.5 V
Lead Channel Pacing Threshold Amplitude: 0.625 V
Lead Channel Pacing Threshold Amplitude: 0.875 V
Lead Channel Pacing Threshold Pulse Width: 0.4 ms
Lead Channel Pacing Threshold Pulse Width: 0.4 ms
Lead Channel Sensing Intrinsic Amplitude: 4.125 mV
Lead Channel Sensing Intrinsic Amplitude: 4.125 mV
Lead Channel Setting Pacing Amplitude: 2 V
Lead Channel Setting Sensing Sensitivity: 0.3 mV
MDC IDC LEAD IMPLANT DT: 20190318
MDC IDC LEAD LOCATION: 753859
MDC IDC LEAD SERIAL: 442838
MDC IDC MSMT LEADCHNL LV IMPEDANCE VALUE: 261.164
MDC IDC MSMT LEADCHNL LV IMPEDANCE VALUE: 261.164
MDC IDC MSMT LEADCHNL LV IMPEDANCE VALUE: 278.237
MDC IDC MSMT LEADCHNL LV IMPEDANCE VALUE: 608 Ohm
MDC IDC MSMT LEADCHNL LV IMPEDANCE VALUE: 874 Ohm
MDC IDC MSMT LEADCHNL LV IMPEDANCE VALUE: 874 Ohm
MDC IDC MSMT LEADCHNL LV IMPEDANCE VALUE: 874 Ohm
MDC IDC MSMT LEADCHNL LV PACING THRESHOLD PULSEWIDTH: 1 ms
MDC IDC MSMT LEADCHNL RV IMPEDANCE VALUE: 399 Ohm
MDC IDC MSMT LEADCHNL RV IMPEDANCE VALUE: 399 Ohm
MDC IDC MSMT LEADCHNL RV SENSING INTR AMPL: 17.75 mV
MDC IDC MSMT LEADCHNL RV SENSING INTR AMPL: 17.75 mV
MDC IDC SET LEADCHNL LV PACING AMPLITUDE: 1.5 V
MDC IDC SET LEADCHNL LV PACING PULSEWIDTH: 1 ms
MDC IDC SET LEADCHNL RV PACING AMPLITUDE: 2.5 V
MDC IDC SET LEADCHNL RV PACING PULSEWIDTH: 0.4 ms
MDC IDC STAT BRADY AP VP PERCENT: 0.18 %
MDC IDC STAT BRADY AP VS PERCENT: 0.01 %
MDC IDC STAT BRADY AS VP PERCENT: 98.43 %
MDC IDC STAT BRADY RA PERCENT PACED: 0.19 %

## 2018-06-29 DIAGNOSIS — G4733 Obstructive sleep apnea (adult) (pediatric): Secondary | ICD-10-CM | POA: Diagnosis not present

## 2018-08-29 ENCOUNTER — Other Ambulatory Visit (HOSPITAL_COMMUNITY): Payer: Self-pay

## 2018-08-29 MED ORDER — LOSARTAN POTASSIUM 100 MG PO TABS: 100.0000 mg | ORAL_TABLET | Freq: Every day | ORAL | 1 refills | Status: DC

## 2018-09-19 ENCOUNTER — Ambulatory Visit: Payer: BLUE CROSS/BLUE SHIELD

## 2018-09-21 ENCOUNTER — Ambulatory Visit (INDEPENDENT_AMBULATORY_CARE_PROVIDER_SITE_OTHER): Payer: BLUE CROSS/BLUE SHIELD

## 2018-09-21 DIAGNOSIS — I428 Other cardiomyopathies: Secondary | ICD-10-CM | POA: Diagnosis not present

## 2018-09-22 ENCOUNTER — Encounter: Payer: Self-pay | Admitting: Cardiology

## 2018-09-22 LAB — CUP PACEART REMOTE DEVICE CHECK
Battery Remaining Longevity: 99 mo
Brady Statistic AP VS Percent: 0.01 %
Brady Statistic RA Percent Paced: 0.15 %
HighPow Impedance: 74 Ohm
Implantable Lead Implant Date: 20190318
Implantable Lead Implant Date: 20190318
Implantable Lead Location: 753858
Implantable Lead Model: 293
Implantable Lead Model: 5076
Implantable Pulse Generator Implant Date: 20190318
Lead Channel Impedance Value: 246.635
Lead Channel Impedance Value: 265.661
Lead Channel Impedance Value: 361 Ohm
Lead Channel Impedance Value: 399 Ohm
Lead Channel Impedance Value: 475 Ohm
Lead Channel Impedance Value: 513 Ohm
Lead Channel Impedance Value: 551 Ohm
Lead Channel Impedance Value: 874 Ohm
Lead Channel Impedance Value: 874 Ohm
Lead Channel Impedance Value: 988 Ohm
Lead Channel Pacing Threshold Amplitude: 0.75 V
Lead Channel Pacing Threshold Pulse Width: 0.4 ms
Lead Channel Pacing Threshold Pulse Width: 1 ms
Lead Channel Sensing Intrinsic Amplitude: 1.25 mV
Lead Channel Sensing Intrinsic Amplitude: 1.25 mV
Lead Channel Sensing Intrinsic Amplitude: 3.875 mV
Lead Channel Sensing Intrinsic Amplitude: 3.875 mV
Lead Channel Setting Pacing Amplitude: 1.25 V
Lead Channel Setting Pacing Pulse Width: 1 ms
MDC IDC LEAD IMPLANT DT: 20190318
MDC IDC LEAD LOCATION: 753859
MDC IDC LEAD LOCATION: 753860
MDC IDC LEAD SERIAL: 442838
MDC IDC MSMT BATTERY VOLTAGE: 3 V
MDC IDC MSMT LEADCHNL LV IMPEDANCE VALUE: 1007 Ohm
MDC IDC MSMT LEADCHNL LV IMPEDANCE VALUE: 246.635
MDC IDC MSMT LEADCHNL LV IMPEDANCE VALUE: 255.093
MDC IDC MSMT LEADCHNL LV IMPEDANCE VALUE: 265.661
MDC IDC MSMT LEADCHNL LV IMPEDANCE VALUE: 513 Ohm
MDC IDC MSMT LEADCHNL LV IMPEDANCE VALUE: 874 Ohm
MDC IDC MSMT LEADCHNL LV IMPEDANCE VALUE: 988 Ohm
MDC IDC MSMT LEADCHNL LV PACING THRESHOLD AMPLITUDE: 0.75 V
MDC IDC MSMT LEADCHNL RA PACING THRESHOLD AMPLITUDE: 0.5 V
MDC IDC MSMT LEADCHNL RA PACING THRESHOLD PULSEWIDTH: 0.4 ms
MDC IDC MSMT LEADCHNL RV IMPEDANCE VALUE: 399 Ohm
MDC IDC SESS DTM: 20191226234105
MDC IDC SET LEADCHNL RA PACING AMPLITUDE: 2 V
MDC IDC SET LEADCHNL RV PACING AMPLITUDE: 2.5 V
MDC IDC SET LEADCHNL RV PACING PULSEWIDTH: 0.4 ms
MDC IDC SET LEADCHNL RV SENSING SENSITIVITY: 0.3 mV
MDC IDC STAT BRADY AP VP PERCENT: 0.14 %
MDC IDC STAT BRADY AS VP PERCENT: 98.72 %
MDC IDC STAT BRADY AS VS PERCENT: 1.13 %
MDC IDC STAT BRADY RV PERCENT PACED: 27.58 %

## 2018-09-22 NOTE — Progress Notes (Signed)
Remote ICD transmission.   

## 2018-09-26 ENCOUNTER — Encounter: Payer: Self-pay | Admitting: Cardiology

## 2018-09-26 DIAGNOSIS — M25571 Pain in right ankle and joints of right foot: Secondary | ICD-10-CM | POA: Diagnosis not present

## 2018-10-04 ENCOUNTER — Telehealth: Payer: Self-pay

## 2018-10-04 DIAGNOSIS — I493 Ventricular premature depolarization: Secondary | ICD-10-CM

## 2018-10-04 NOTE — Telephone Encounter (Signed)
Pt informed of remote check results and recommendations. He is agrees to having a zio patch placed. Order has been placed and message sent to scheduling.

## 2018-10-04 NOTE — Telephone Encounter (Signed)
-----   Message from Wanda Plump, RN sent at 10/02/2018  3:56 PM EST -----  ----- Message ----- From: Deboraha Sprang, MD Sent: 10/02/2018   3:23 PM EST To: Dollene Primrose, RN, [facility] Device  Remote reviewed. This remote is abnormal for increase in spontaneous, non paced beats Based on the histograms, it looks like PVCs at about 20%  This could have an impact on LV function so would llike to do ZIO 14d patch for PVC burden  thnaks

## 2018-10-06 ENCOUNTER — Ambulatory Visit (INDEPENDENT_AMBULATORY_CARE_PROVIDER_SITE_OTHER): Payer: BLUE CROSS/BLUE SHIELD

## 2018-10-06 DIAGNOSIS — I493 Ventricular premature depolarization: Secondary | ICD-10-CM

## 2018-10-10 DIAGNOSIS — M25571 Pain in right ankle and joints of right foot: Secondary | ICD-10-CM | POA: Diagnosis not present

## 2018-10-16 DIAGNOSIS — M7660 Achilles tendinitis, unspecified leg: Secondary | ICD-10-CM | POA: Diagnosis not present

## 2018-10-16 DIAGNOSIS — R531 Weakness: Secondary | ICD-10-CM | POA: Diagnosis not present

## 2018-10-16 DIAGNOSIS — M25671 Stiffness of right ankle, not elsewhere classified: Secondary | ICD-10-CM | POA: Diagnosis not present

## 2018-10-19 DIAGNOSIS — M7660 Achilles tendinitis, unspecified leg: Secondary | ICD-10-CM | POA: Diagnosis not present

## 2018-10-19 DIAGNOSIS — M25671 Stiffness of right ankle, not elsewhere classified: Secondary | ICD-10-CM | POA: Diagnosis not present

## 2018-10-23 DIAGNOSIS — R531 Weakness: Secondary | ICD-10-CM | POA: Diagnosis not present

## 2018-10-23 DIAGNOSIS — M7660 Achilles tendinitis, unspecified leg: Secondary | ICD-10-CM | POA: Diagnosis not present

## 2018-10-23 DIAGNOSIS — M25671 Stiffness of right ankle, not elsewhere classified: Secondary | ICD-10-CM | POA: Diagnosis not present

## 2018-10-27 DIAGNOSIS — I493 Ventricular premature depolarization: Secondary | ICD-10-CM | POA: Diagnosis not present

## 2018-10-30 DIAGNOSIS — R531 Weakness: Secondary | ICD-10-CM | POA: Diagnosis not present

## 2018-10-30 DIAGNOSIS — M7660 Achilles tendinitis, unspecified leg: Secondary | ICD-10-CM | POA: Diagnosis not present

## 2018-10-30 DIAGNOSIS — M25671 Stiffness of right ankle, not elsewhere classified: Secondary | ICD-10-CM | POA: Diagnosis not present

## 2018-11-03 DIAGNOSIS — M25671 Stiffness of right ankle, not elsewhere classified: Secondary | ICD-10-CM | POA: Diagnosis not present

## 2018-11-03 DIAGNOSIS — M7660 Achilles tendinitis, unspecified leg: Secondary | ICD-10-CM | POA: Diagnosis not present

## 2018-11-06 DIAGNOSIS — R531 Weakness: Secondary | ICD-10-CM | POA: Diagnosis not present

## 2018-11-06 DIAGNOSIS — M25671 Stiffness of right ankle, not elsewhere classified: Secondary | ICD-10-CM | POA: Diagnosis not present

## 2018-11-06 DIAGNOSIS — M7660 Achilles tendinitis, unspecified leg: Secondary | ICD-10-CM | POA: Diagnosis not present

## 2018-11-20 ENCOUNTER — Telehealth: Payer: Self-pay | Admitting: Internal Medicine

## 2018-11-20 NOTE — Telephone Encounter (Signed)
New Message   Patient calling for results to Holter Monitor test.

## 2018-11-22 DIAGNOSIS — M7660 Achilles tendinitis, unspecified leg: Secondary | ICD-10-CM | POA: Diagnosis not present

## 2018-11-22 DIAGNOSIS — M25671 Stiffness of right ankle, not elsewhere classified: Secondary | ICD-10-CM | POA: Diagnosis not present

## 2018-11-22 NOTE — Telephone Encounter (Signed)
Reviewed monitor results and recommendations with pt. Per Dr Caryl Comes, pt needs to come in for a device check. Pt was offered an appt next week but will be out of town. Pt agreed to come in on March 10 @ 1:30pm.

## 2018-12-05 ENCOUNTER — Encounter: Payer: Self-pay | Admitting: Internal Medicine

## 2018-12-05 ENCOUNTER — Ambulatory Visit: Payer: BLUE CROSS/BLUE SHIELD | Admitting: Internal Medicine

## 2018-12-05 VITALS — BP 110/86 | HR 85 | Ht 68.0 in | Wt 207.6 lb

## 2018-12-05 DIAGNOSIS — I428 Other cardiomyopathies: Secondary | ICD-10-CM

## 2018-12-05 DIAGNOSIS — I447 Left bundle-branch block, unspecified: Secondary | ICD-10-CM | POA: Diagnosis not present

## 2018-12-05 DIAGNOSIS — I493 Ventricular premature depolarization: Secondary | ICD-10-CM

## 2018-12-05 LAB — CUP PACEART INCLINIC DEVICE CHECK
Battery Remaining Longevity: 93 mo
Battery Voltage: 2.99 V
Brady Statistic AP VP Percent: 0.16 %
Brady Statistic AP VS Percent: 0.01 %
Brady Statistic AS VP Percent: 98.66 %
Brady Statistic RA Percent Paced: 0.18 %
Brady Statistic RV Percent Paced: 26.59 %
HighPow Impedance: 69 Ohm
Implantable Lead Implant Date: 20190318
Implantable Lead Implant Date: 20190318
Implantable Lead Implant Date: 20190318
Implantable Lead Location: 753858
Implantable Lead Location: 753859
Implantable Lead Model: 293
Implantable Lead Model: 5076
Implantable Lead Serial Number: 442838
Implantable Pulse Generator Implant Date: 20190318
Lead Channel Impedance Value: 237.5 Ohm
Lead Channel Impedance Value: 246.635
Lead Channel Impedance Value: 266.667
Lead Channel Impedance Value: 266.667
Lead Channel Impedance Value: 278.237
Lead Channel Impedance Value: 342 Ohm
Lead Channel Impedance Value: 361 Ohm
Lead Channel Impedance Value: 361 Ohm
Lead Channel Impedance Value: 475 Ohm
Lead Channel Impedance Value: 475 Ohm
Lead Channel Impedance Value: 513 Ohm
Lead Channel Impedance Value: 608 Ohm
Lead Channel Impedance Value: 779 Ohm
Lead Channel Impedance Value: 817 Ohm
Lead Channel Impedance Value: 950 Ohm
Lead Channel Impedance Value: 988 Ohm
Lead Channel Pacing Threshold Amplitude: 0.5 V
Lead Channel Pacing Threshold Amplitude: 0.75 V
Lead Channel Pacing Threshold Amplitude: 0.875 V
Lead Channel Pacing Threshold Pulse Width: 0.4 ms
Lead Channel Pacing Threshold Pulse Width: 0.4 ms
Lead Channel Pacing Threshold Pulse Width: 1 ms
Lead Channel Sensing Intrinsic Amplitude: 15.125 mV
Lead Channel Sensing Intrinsic Amplitude: 3.875 mV
Lead Channel Sensing Intrinsic Amplitude: 5.125 mV
Lead Channel Setting Pacing Amplitude: 1.25 V
Lead Channel Setting Pacing Amplitude: 2 V
Lead Channel Setting Pacing Amplitude: 2.5 V
Lead Channel Setting Pacing Pulse Width: 0.4 ms
Lead Channel Setting Sensing Sensitivity: 0.3 mV
MDC IDC LEAD LOCATION: 753860
MDC IDC MSMT LEADCHNL LV IMPEDANCE VALUE: 817 Ohm
MDC IDC MSMT LEADCHNL LV IMPEDANCE VALUE: 950 Ohm
MDC IDC MSMT LEADCHNL RV SENSING INTR AMPL: 15.125 mV
MDC IDC SESS DTM: 20200310140153
MDC IDC SET LEADCHNL LV PACING PULSEWIDTH: 1 ms
MDC IDC STAT BRADY AS VS PERCENT: 1.17 %

## 2018-12-05 NOTE — Progress Notes (Signed)
Patient Care Team: Marton Redwood, MD as PCP - General (Internal Medicine)   HPI  Joseph Parrish is a 53 y.o. male Seen in follow-up for CRT-D for nonischemic cardiomyopathy and left bundle branch block.  The patient denies chest pain, shortness of breath, nocturnal dyspnea, orthopnea or peripheral edema.  There have been no palpitations, lightheadednes or syncope  Seen today as effCRT number on interrogation was significantly depressed for reasons not clear discordant from PVC count  DATE TEST EF   6/18 Echo   25 %   7/18 Cath    40 % Normal CA  11/18 cMRI  26 LGE neg  6/19 Echo  45-50%     Date Cr K Hgb  3/19 1.39 4.8 15.8           Past Medical History:  Diagnosis Date  . Allergy   . CHF (congestive heart failure) (HCC)    cardiomyopathy  . Cholecystitis   . Cholelithiasis   . Diverticulosis   . GERD (gastroesophageal reflux disease)    diet   . Gout   . Headache    migraines  . History of kidney stones feb 2018 passed   . Hypertension   . Sleep apnea    new dx sept 2018 looking at dental appliances for mild osa    Past Surgical History:  Procedure Laterality Date  . BIV ICD INSERTION CRT-D N/A 12/12/2017   Procedure: BIV ICD INSERTION CRT-D;  Surgeon: Deboraha Sprang, MD;  Location: [facility];  Service: Cardiovascular;  Laterality: N/A;  . CHOLECYSTECTOMY  01/2017  . COLONOSCOPY WITH PROPOFOL N/A 07/26/2017   Procedure: COLONOSCOPY WITH PROPOFOL;  Surgeon: Jerene Bears, MD;  Location: [facility];  Service: Gastroenterology;  Laterality: N/A;  . LEFT HEART CATH AND CORONARY ANGIOGRAPHY N/A 04/08/2017   Procedure: Left Heart Cath and Coronary Angiography;  Surgeon: Jolaine Artist, MD;  Location: [facility];  Service: Cardiovascular;  Laterality: N/A;  . TONSILLECTOMY AND ADENOIDECTOMY      Current Meds  Medication Sig  . acetaminophen (TYLENOL) 500 MG tablet Take 500-1,000 mg by mouth every 6 (six) hours as needed for  moderate pain or headache.   Marland Kitchen atovaquone-proguanil (MALARONE) 250-100 MG TABS tablet Take 1 tablet by mouth daily.  Marland Kitchen azithromycin (ZITHROMAX) 250 MG tablet Take 1 tablet by mouth daily.  . carvedilol (COREG) 12.5 MG tablet TAKE 1 TABLET (12.5 MG TOTAL) BY MOUTH 2 (TWO) TIMES DAILY WITH A MEAL.  Marland Kitchen colchicine 0.6 MG tablet Take 0.6 mg daily as needed by mouth (for gout flares).  Marland Kitchen dextromethorphan-guaiFENesin (MUCINEX DM) 30-600 MG 12hr tablet Take 1 tablet by mouth 2 (two) times daily.  Marland Kitchen ibuprofen (ADVIL,MOTRIN) 200 MG tablet Take 600 mg by mouth every 8 (eight) hours as needed for headache or moderate pain.   Marland Kitchen loratadine (CLARITIN) 10 MG tablet Take 10 mg by mouth at bedtime.   . protriptyline (VIVACTIL) 10 MG tablet Take 10 mg by mouth at bedtime.    No Known Allergies    Review of Systems negative except from HPI and PMH  Physical Exam BP 110/86   Pulse 85   Ht 5\' 8"  (1.727 m)   Wt 207 lb 9.6 oz (94.2 kg)   SpO2 96%   BMI 31.57 kg/m  Well developed and well nourished in no acute distress HENT normal Neck supple with JVP-flat Clear Device pocket well healed; without hematoma or erythema.  There is no  tethering  Regular rate and rhythm, no  gallop No murmur Abd-soft with active BS No Clubbing cyanosis  edema Skin-warm and dry A & Oriented  Grossly normal sensory and motor function  ECG sinus @ 87  15/14/36 qR lead 1 and rS V1   Assessment and  Plan  LBBB  NICM ' CRT -[employer]   Device was reprogrammed to increase max tracking rate which seems tobe a contributer QRS is much shorter with aCRT than Biv  Current medicines are reviewed at length with the patient today .  The patient does not  have concerns regarding medicines.

## 2018-12-05 NOTE — Patient Instructions (Signed)
Medication Instructions:  Your physician recommends that you continue on your current medications as directed. Please refer to the Current Medication list given to you today.  Labwork: None ordered.  Testing/Procedures: Your physician has requested that you have an echocardiogram. Echocardiography is a painless test that uses sound waves to create images of your heart. It provides your doctor with information about the size and shape of your heart and how well your heart's chambers and valves are working. This procedure takes approximately one hour. There are no restrictions for this procedure.   Follow-Up: Your physician recommends that you schedule a follow-up appointment in:   One Year with Dr Klein  Any Other Special Instructions Will Be Listed Below (If Applicable).     If you need a refill on your cardiac medications before your next appointment, please call your pharmacy.  

## 2018-12-11 ENCOUNTER — Other Ambulatory Visit (HOSPITAL_COMMUNITY): Payer: BLUE CROSS/BLUE SHIELD

## 2018-12-12 ENCOUNTER — Ambulatory Visit (HOSPITAL_COMMUNITY): Payer: BLUE CROSS/BLUE SHIELD | Attending: Cardiovascular Disease

## 2018-12-12 ENCOUNTER — Other Ambulatory Visit: Payer: Self-pay

## 2018-12-12 DIAGNOSIS — I428 Other cardiomyopathies: Secondary | ICD-10-CM

## 2018-12-12 DIAGNOSIS — I447 Left bundle-branch block, unspecified: Secondary | ICD-10-CM | POA: Diagnosis not present

## 2018-12-12 DIAGNOSIS — I493 Ventricular premature depolarization: Secondary | ICD-10-CM | POA: Diagnosis not present

## 2018-12-21 ENCOUNTER — Ambulatory Visit (INDEPENDENT_AMBULATORY_CARE_PROVIDER_SITE_OTHER): Payer: BLUE CROSS/BLUE SHIELD | Admitting: *Deleted

## 2018-12-21 ENCOUNTER — Other Ambulatory Visit: Payer: Self-pay

## 2018-12-21 DIAGNOSIS — I428 Other cardiomyopathies: Secondary | ICD-10-CM | POA: Diagnosis not present

## 2018-12-21 DIAGNOSIS — I5022 Chronic systolic (congestive) heart failure: Secondary | ICD-10-CM

## 2018-12-21 LAB — CUP PACEART REMOTE DEVICE CHECK
Battery Remaining Longevity: 94 mo
Brady Statistic AP VP Percent: 0.9 %
Brady Statistic AP VS Percent: 0.02 %
Brady Statistic AS VS Percent: 1.03 %
Brady Statistic RA Percent Paced: 0.93 %
Brady Statistic RV Percent Paced: 12.59 %
Date Time Interrogation Session: 20200326073523
HighPow Impedance: 70 Ohm
Implantable Lead Implant Date: 20190318
Implantable Lead Implant Date: 20190318
Implantable Lead Location: 753858
Implantable Lead Location: 753859
Implantable Lead Location: 753860
Implantable Lead Model: 293
Implantable Lead Model: 5076
Implantable Pulse Generator Implant Date: 20190318
Lead Channel Impedance Value: 250.943
Lead Channel Impedance Value: 266.667
Lead Channel Impedance Value: 278.237
Lead Channel Impedance Value: 283.733
Lead Channel Impedance Value: 361 Ohm
Lead Channel Impedance Value: 361 Ohm
Lead Channel Impedance Value: 361 Ohm
Lead Channel Impedance Value: 513 Ohm
Lead Channel Impedance Value: 532 Ohm
Lead Channel Impedance Value: 608 Ohm
Lead Channel Impedance Value: 817 Ohm
Lead Channel Impedance Value: 836 Ohm
Lead Channel Impedance Value: 836 Ohm
Lead Channel Impedance Value: 950 Ohm
Lead Channel Impedance Value: 988 Ohm
Lead Channel Impedance Value: 988 Ohm
Lead Channel Pacing Threshold Amplitude: 0.5 V
Lead Channel Pacing Threshold Amplitude: 0.75 V
Lead Channel Pacing Threshold Amplitude: 0.875 V
Lead Channel Pacing Threshold Pulse Width: 0.4 ms
Lead Channel Pacing Threshold Pulse Width: 0.4 ms
Lead Channel Pacing Threshold Pulse Width: 1 ms
Lead Channel Sensing Intrinsic Amplitude: 13.25 mV
Lead Channel Sensing Intrinsic Amplitude: 13.25 mV
Lead Channel Sensing Intrinsic Amplitude: 4.25 mV
Lead Channel Setting Pacing Amplitude: 1.25 V
Lead Channel Setting Pacing Amplitude: 2 V
Lead Channel Setting Pacing Amplitude: 2.5 V
Lead Channel Setting Pacing Pulse Width: 0.4 ms
Lead Channel Setting Sensing Sensitivity: 0.3 mV
MDC IDC LEAD IMPLANT DT: 20190318
MDC IDC LEAD SERIAL: 442838
MDC IDC MSMT BATTERY VOLTAGE: 3 V
MDC IDC MSMT LEADCHNL LV IMPEDANCE VALUE: 246.635
MDC IDC MSMT LEADCHNL LV IMPEDANCE VALUE: 475 Ohm
MDC IDC MSMT LEADCHNL RA SENSING INTR AMPL: 4.25 mV
MDC IDC SET LEADCHNL LV PACING PULSEWIDTH: 1 ms
MDC IDC STAT BRADY AS VP PERCENT: 98.04 %

## 2018-12-28 NOTE — Progress Notes (Signed)
Remote ICD transmission.   

## 2019-01-02 DIAGNOSIS — R7301 Impaired fasting glucose: Secondary | ICD-10-CM | POA: Diagnosis not present

## 2019-01-02 DIAGNOSIS — I10 Essential (primary) hypertension: Secondary | ICD-10-CM | POA: Diagnosis not present

## 2019-01-02 DIAGNOSIS — Z125 Encounter for screening for malignant neoplasm of prostate: Secondary | ICD-10-CM | POA: Diagnosis not present

## 2019-01-02 DIAGNOSIS — E7849 Other hyperlipidemia: Secondary | ICD-10-CM | POA: Diagnosis not present

## 2019-01-03 ENCOUNTER — Telehealth: Payer: Self-pay

## 2019-01-03 NOTE — Telephone Encounter (Signed)
Spoke with pt regarding his upcoming appt with Dr Caryl Comes. Pt states he is feeling well and does not need his follow up appt as he saw Dr Caryl Comes less than a month ago. He will follow up as scheduled in one year unless he needs [place] sooner. Appt ok to cancel.

## 2019-01-03 NOTE — Telephone Encounter (Signed)
LVM for return call regarding upcoming appt.  

## 2019-01-09 ENCOUNTER — Telehealth: Payer: BLUE CROSS/BLUE SHIELD | Admitting: Internal Medicine

## 2019-01-09 DIAGNOSIS — Z1331 Encounter for screening for depression: Secondary | ICD-10-CM | POA: Diagnosis not present

## 2019-01-09 DIAGNOSIS — Z0000 Encounter for general adult medical examination without abnormal findings: Secondary | ICD-10-CM | POA: Diagnosis not present

## 2019-01-09 DIAGNOSIS — R7301 Impaired fasting glucose: Secondary | ICD-10-CM | POA: Diagnosis not present

## 2019-01-09 DIAGNOSIS — E781 Pure hyperglyceridemia: Secondary | ICD-10-CM | POA: Diagnosis not present

## 2019-01-09 DIAGNOSIS — I10 Essential (primary) hypertension: Secondary | ICD-10-CM | POA: Diagnosis not present

## 2019-01-09 DIAGNOSIS — E785 Hyperlipidemia, unspecified: Secondary | ICD-10-CM | POA: Diagnosis not present

## 2019-02-17 ENCOUNTER — Other Ambulatory Visit (HOSPITAL_COMMUNITY): Payer: Self-pay | Admitting: Internal Medicine

## 2019-03-04 ENCOUNTER — Other Ambulatory Visit (HOSPITAL_COMMUNITY): Payer: Self-pay | Admitting: Internal Medicine

## 2019-03-22 ENCOUNTER — Ambulatory Visit (INDEPENDENT_AMBULATORY_CARE_PROVIDER_SITE_OTHER): Payer: BC Managed Care – PPO | Admitting: *Deleted

## 2019-03-22 DIAGNOSIS — I428 Other cardiomyopathies: Secondary | ICD-10-CM | POA: Diagnosis not present

## 2019-03-23 ENCOUNTER — Telehealth (HOSPITAL_COMMUNITY): Payer: Self-pay

## 2019-03-23 LAB — CUP PACEART REMOTE DEVICE CHECK
Battery Remaining Longevity: 94 mo
Battery Voltage: 2.99 V
Brady Statistic AP VP Percent: 3.58 %
Brady Statistic AP VS Percent: 0.08 %
Brady Statistic AS VP Percent: 95.27 %
Brady Statistic AS VS Percent: 1.08 %
Brady Statistic RA Percent Paced: 3.66 %
Brady Statistic RV Percent Paced: 10.19 %
Date Time Interrogation Session: 20200625052403
HighPow Impedance: 72 Ohm
Implantable Lead Implant Date: 20190318
Implantable Lead Implant Date: 20190318
Implantable Lead Implant Date: 20190318
Implantable Lead Location: 753858
Implantable Lead Location: 753859
Implantable Lead Location: 753860
Implantable Lead Model: 293
Implantable Lead Model: 5076
Implantable Lead Serial Number: 442838
Implantable Pulse Generator Implant Date: 20190318
Lead Channel Impedance Value: 1007 Ohm
Lead Channel Impedance Value: 246.635
Lead Channel Impedance Value: 250.943
Lead Channel Impedance Value: 266.667
Lead Channel Impedance Value: 278.237
Lead Channel Impedance Value: 283.733
Lead Channel Impedance Value: 361 Ohm
Lead Channel Impedance Value: 361 Ohm
Lead Channel Impedance Value: 399 Ohm
Lead Channel Impedance Value: 475 Ohm
Lead Channel Impedance Value: 513 Ohm
Lead Channel Impedance Value: 532 Ohm
Lead Channel Impedance Value: 608 Ohm
Lead Channel Impedance Value: 779 Ohm
Lead Channel Impedance Value: 836 Ohm
Lead Channel Impedance Value: 874 Ohm
Lead Channel Impedance Value: 988 Ohm
Lead Channel Impedance Value: 988 Ohm
Lead Channel Pacing Threshold Amplitude: 0.5 V
Lead Channel Pacing Threshold Amplitude: 0.75 V
Lead Channel Pacing Threshold Amplitude: 0.75 V
Lead Channel Pacing Threshold Pulse Width: 0.4 ms
Lead Channel Pacing Threshold Pulse Width: 0.4 ms
Lead Channel Pacing Threshold Pulse Width: 1 ms
Lead Channel Sensing Intrinsic Amplitude: 14.5 mV
Lead Channel Sensing Intrinsic Amplitude: 14.5 mV
Lead Channel Sensing Intrinsic Amplitude: 4.375 mV
Lead Channel Sensing Intrinsic Amplitude: 4.375 mV
Lead Channel Setting Pacing Amplitude: 1.25 V
Lead Channel Setting Pacing Amplitude: 2 V
Lead Channel Setting Pacing Amplitude: 2.5 V
Lead Channel Setting Pacing Pulse Width: 0.4 ms
Lead Channel Setting Pacing Pulse Width: 1 ms
Lead Channel Setting Sensing Sensitivity: 0.3 mV

## 2019-03-23 MED ORDER — FUROSEMIDE 20 MG PO TABS: 20.0000 mg | ORAL_TABLET | ORAL | 0 refills | Status: DC | PRN

## 2019-03-23 NOTE — Telephone Encounter (Signed)
Pt called, c/o swelling in legs that hasnt been a problem for him until recently. hasnt been seen by [place] since 7/19.

## 2019-03-23 NOTE — Telephone Encounter (Signed)
Spoke with pt to relay orders from Amy NP-C. Pt verbalized understanding. Made f/u. No further questions.

## 2019-03-23 NOTE — Addendum Note (Signed)
Addended by: Lindley Magnus A on: 03/23/2019 02:29 PM   Modules accepted: Orders

## 2019-03-23 NOTE — Telephone Encounter (Signed)
Set up f/u   Call and verify pharmacy then send in script/   Take lasix 20 mg  As needed . # 20.   Instruct to take 20 mg lasix daily for 2 days.   Thanks Amy CLegg NP-C

## 2019-04-02 ENCOUNTER — Encounter: Payer: Self-pay | Admitting: Cardiology

## 2019-04-02 NOTE — Progress Notes (Signed)
Remote ICD transmission.   

## 2019-04-04 ENCOUNTER — Other Ambulatory Visit: Payer: Self-pay

## 2019-04-04 ENCOUNTER — Ambulatory Visit (HOSPITAL_COMMUNITY)
Admission: RE | Admit: 2019-04-04 | Discharge: 2019-04-04 | Disposition: A | Discharge status: Home / Self Care | Payer: BC Managed Care – PPO | Source: Ambulatory Visit | Attending: Internal Medicine | Admitting: Internal Medicine

## 2019-04-04 ENCOUNTER — Encounter (HOSPITAL_COMMUNITY): Payer: Self-pay | Admitting: Internal Medicine

## 2019-04-04 VITALS — BP 120/76 | HR 67 | Wt 216.0 lb

## 2019-04-04 DIAGNOSIS — E785 Hyperlipidemia, unspecified: Secondary | ICD-10-CM | POA: Diagnosis not present

## 2019-04-04 DIAGNOSIS — I110 Hypertensive heart disease with heart failure: Secondary | ICD-10-CM | POA: Insufficient documentation

## 2019-04-04 DIAGNOSIS — K219 Gastro-esophageal reflux disease without esophagitis: Secondary | ICD-10-CM | POA: Diagnosis not present

## 2019-04-04 DIAGNOSIS — I428 Other cardiomyopathies: Secondary | ICD-10-CM | POA: Insufficient documentation

## 2019-04-04 DIAGNOSIS — Z95810 Presence of automatic (implantable) cardiac defibrillator: Secondary | ICD-10-CM | POA: Diagnosis not present

## 2019-04-04 DIAGNOSIS — M109 Gout, unspecified: Secondary | ICD-10-CM | POA: Insufficient documentation

## 2019-04-04 DIAGNOSIS — Z79899 Other long term (current) drug therapy: Secondary | ICD-10-CM | POA: Diagnosis not present

## 2019-04-04 DIAGNOSIS — I447 Left bundle-branch block, unspecified: Secondary | ICD-10-CM | POA: Insufficient documentation

## 2019-04-04 DIAGNOSIS — Z87442 Personal history of urinary calculi: Secondary | ICD-10-CM | POA: Diagnosis not present

## 2019-04-04 DIAGNOSIS — I5022 Chronic systolic (congestive) heart failure: Secondary | ICD-10-CM | POA: Diagnosis not present

## 2019-04-04 DIAGNOSIS — G4733 Obstructive sleep apnea (adult) (pediatric): Secondary | ICD-10-CM | POA: Diagnosis not present

## 2019-04-04 LAB — CBC
HCT: 45.7 % (ref 39.0–52.0)
Hemoglobin: 15.6 g/dL (ref 13.0–17.0)
MCH: 32.3 pg (ref 26.0–34.0)
MCHC: 34.1 g/dL (ref 30.0–36.0)
MCV: 94.6 fL (ref 80.0–100.0)
Platelets: 264 10*3/uL (ref 150–400)
RBC: 4.83 MIL/uL (ref 4.22–5.81)
RDW: 12 % (ref 11.5–15.5)
WBC: 8 10*3/uL (ref 4.0–10.5)
nRBC: 0 % (ref 0.0–0.2)

## 2019-04-04 LAB — BASIC METABOLIC PANEL
Anion gap: 10 (ref 5–15)
BUN: 17 mg/dL (ref 6–20)
CO2: 23 mmol/L (ref 22–32)
Calcium: 9.4 mg/dL (ref 8.9–10.3)
Chloride: 106 mmol/L (ref 98–111)
Creatinine, Ser: 1.25 mg/dL — ABNORMAL HIGH (ref 0.61–1.24)
GFR calc Af Amer: >60 mL/min (ref >60)
GFR calc non Af Amer: >60 mL/min (ref >60)
Glucose, Bld: 97 mg/dL (ref 70–99)
Potassium: 4.6 mmol/L (ref 3.5–5.1)
Sodium: 139 mmol/L (ref 135–145)

## 2019-04-04 LAB — BRAIN NATRIURETIC PEPTIDE: B Natriuretic Peptide: 17.4 pg/mL (ref 0.0–100.0)

## 2019-04-04 NOTE — Progress Notes (Signed)
[facility] Consult Note   Referring Physician: Dr. Brigitte Pulse Primary Care: Dr. Brigitte Pulse Primary Cardiologist: New to Dr. Haroldine Laws   HPI: Mr. Pickard is a 53 year old male with a past medical history of HTN, gout. Newly diagnosed with systolic CHF in June 8099 with EF 25%. He is referred by Dr. Brigitte Pulse for further management of HF.    He was seen in the ED in 10/2016 with abdominal pain, found to have acute cholecystitis. S/p lap chole.   He was seen in Dr. Raul Del office for regular follow up and noted to have a LBBB on EKG. He was then referred for an Echo. EF was reduced at 25% and referred to the [facility] Clinic for evaluation.  Underwent cath in 7/18 which showed normal coronary arteries. EF 40% We ordered cMRI to look for infiltrative or orther processes but this was denied. Also had sleep study with AHI ~ 5.   - cMRI 11/18 EF 26% no LGE  Underwent [employer] CRT-D implant on 12/12/17 with Dr. Bosie Helper.   Echo 3/20: EF 50-55%  Returns today for HF follow up. Several weeks ago developed some volume overload for the first time and we prescribed several days of lasix and improved. Has only taken one more lasix since. Continues to exercise wit F3 but says he is just not back to where he was. At times feels gurgling in his throat and like there is a "sponge in his lungs" like he keeps on moving air after he has taken a full breath. No edema, orthopnea or PND.   ICD interrogation today: No AF/VT. 100% vpacing. Excellent HRV. Activity level 4hours ped day. At end of May and early June had period of fluid overload (no corresponding loss of pacing or AF)  LHC 7/18   Normal coronary arteries EF ~40% by V-gram   Past Medical History:  Diagnosis Date  . Allergy   . CHF (congestive heart failure) (HCC)    cardiomyopathy  . Cholecystitis   . Cholelithiasis   . Diverticulosis   . GERD (gastroesophageal reflux disease)    diet   . Gout   . Headache    migraines  . History of  kidney stones feb 2018 passed   . Hypertension   . Sleep apnea    new dx sept 2018 looking at dental appliances for mild osa    Current Outpatient Medications  Medication Sig Dispense Refill  . acetaminophen (TYLENOL) 500 MG tablet Take 500-1,000 mg by mouth every 6 (six) hours as needed for moderate pain or headache.     Marland Kitchen atovaquone-proguanil (MALARONE) 250-100 MG TABS tablet Take 1 tablet by mouth daily.  0  . carvedilol (COREG) 12.5 MG tablet TAKE 1 TABLET (12.5 MG TOTAL) BY MOUTH 2 (TWO) TIMES DAILY WITH A MEAL. 180 tablet 3  . colchicine 0.6 MG tablet Take 0.6 mg daily as needed by mouth (for gout flares).    Marland Kitchen dextromethorphan-guaiFENesin (MUCINEX DM) 30-600 MG 12hr tablet Take 1 tablet by mouth 2 (two) times daily.    . furosemide (LASIX) 20 MG tablet Take 1 tablet (20 mg total) by mouth as needed. For swelling. 20 tablet 0  . ibuprofen (ADVIL,MOTRIN) 200 MG tablet Take 600 mg by mouth every 8 (eight) hours as needed for headache or moderate pain.     Marland Kitchen loratadine (CLARITIN) 10 MG tablet Take 10 mg by mouth at bedtime.     Marland Kitchen losartan (COZAAR) 100 MG tablet TAKE 1 TABLET BY  MOUTH EVERY DAY 90 tablet 0  . protriptyline (VIVACTIL) 10 MG tablet Take 10 mg by mouth at bedtime.  3   No current facility-administered medications for this encounter.     No Known Allergies    Social History   Socioeconomic History  . Marital status: Single    Spouse name: Not on file  . Number of children: Not on file  . Years of education: Not on file  . Highest education level: Not on file  Occupational History  . Not on file  Social Needs  . Financial resource strain: Not on file  . Food insecurity    Worry: Not on file    Inability: Not on file  . Transportation needs    Medical: Not on file    Non-medical: Not on file  Tobacco Use  . Smoking status: Never Smoker  . Smokeless tobacco: Never Used  Substance and Sexual Activity  . Alcohol use: Yes    Alcohol/week: 0.0 standard drinks     Comment: socially  . Drug use: No  . Sexual activity: Not on file  Lifestyle  . Physical activity    Days per week: 3 days    Minutes per session: 30 min  . Stress: Not at all  Relationships  . Social Herbalist on phone: Patient refused    Gets together: Patient refused    Attends religious service: Patient refused    Active member of club or organization: Patient refused    Attends meetings of clubs or organizations: Patient refused    Relationship status: Patient refused  . Intimate partner violence    Fear of current or ex partner: Patient refused    Emotionally abused: Patient refused    Physically abused: Patient refused    Forced sexual activity: Patient refused  Other Topics Concern  . Not on file  Social History Narrative  . Not on file      Family History  Problem Relation Age of Onset  . Aneurysm Father 51  . Stroke Paternal Grandfather   . Colon cancer Neg Hx   . Colon polyps Neg Hx   . Esophageal cancer Neg Hx   . Rectal cancer Neg Hx   . Stomach cancer Neg Hx     Vitals:   04/04/19 1458  BP: 120/76  Pulse: 67  SpO2: 96%  Weight: 98 kg (216 lb)     PHYSICAL EXAM: General:  Well appearing. No resp difficulty HEENT: normal Neck: supple. no JVD. Carotids 2+ bilat; no bruits. No lymphadenopathy or thryomegaly appreciated. Cor: PMI nondisplaced. Regular rate & rhythm. No rubs, gallops or murmurs. Lungs: clear Abdomen: soft, nontender, nondistended. No hepatosplenomegaly. No bruits or masses. Good bowel sounds. Extremities: no cyanosis, clubbing, rash, edema Neuro: alert & orientedx3, cranial nerves grossly intact. moves all 4 extremities w/o difficulty. Affect pleasant   ASSESSMENT & PLAN: 1. Chronic systolic CHF:  - NICM with EF 35-40% on previous echo and cath. - cMRI 11/18 EF 26% no LGE - s/p MDT CRT-D placement 3/19. QRS 124ms->122ms portends good respone - Echo 02/2018: EF 45-50%, grade 2 DD - Echo 3/20 EF 50-55% (improved  with CRT) - Overall doing well NYHA class I-II - Volume status looks good currently but did have volume overload last month for unclear reasons.  - ICD interrogation as sabove.  - Continue losartan to 100mg  daily.  - Continue carvedilol 12.5mg  bid - Have held off on spiro with last K 4.8 - Unclear  what caused his volume overload last month. Now resolved with lasix. Will check BMET and if K improved can consider low dose spiro. Otherwise continue watchful waiting  - Will check ReDS  2. LBBB - Likely the cause of his cardiomyopathy.  - s/p MDT CRT-D 3/19 - Follows with Dr Caryl Comes.  3. HLD - Follows with Dr. Brigitte Pulse.    Glori Bickers, MD 04/04/19

## 2019-04-04 NOTE — Patient Instructions (Addendum)
Labs done today. We will call you only if labs are abnormal.   Your physician recommends that you schedule a follow-up appointment in: 2-3 months with Dr. Haroldine Laws. We will contact you to schedule this appointment.   At the [facility], you and your health needs are our priority. As part of our continuing mission to provide you with exceptional heart care, we have created designated Provider Care Teams. These Care Teams include your primary Cardiologist (physician) and Advanced Practice Providers (APPs- Physician Assistants and Nurse Practitioners) who all work together to provide you with the care you need, when you need it.   You may see any of the following providers on your designated Care Team at your next follow up: Marland Kitchen Dr Glori Bickers . Dr Loralie Champagne . Darrick Grinder, NP

## 2019-04-04 NOTE — Addendum Note (Signed)
Encounter addended by: Jolaine Artist, MD on: 04/04/2019 11:37 PM  Actions taken: Level of Service modified, Visit diagnoses modified, Charge Capture section accepted, Clinical Note Signed

## 2019-04-19 ENCOUNTER — Other Ambulatory Visit (HOSPITAL_COMMUNITY): Payer: Self-pay | Admitting: Adult Health

## 2019-05-10 DIAGNOSIS — M25571 Pain in right ankle and joints of right foot: Secondary | ICD-10-CM | POA: Diagnosis not present

## 2019-05-19 ENCOUNTER — Other Ambulatory Visit (HOSPITAL_COMMUNITY): Payer: Self-pay | Admitting: Internal Medicine

## 2019-06-21 ENCOUNTER — Ambulatory Visit (INDEPENDENT_AMBULATORY_CARE_PROVIDER_SITE_OTHER): Payer: BC Managed Care – PPO | Admitting: *Deleted

## 2019-06-21 DIAGNOSIS — I428 Other cardiomyopathies: Secondary | ICD-10-CM | POA: Diagnosis not present

## 2019-06-21 LAB — CUP PACEART REMOTE DEVICE CHECK
Battery Remaining Longevity: 93 mo
Battery Voltage: 2.99 V
Brady Statistic AP VP Percent: 5.22 %
Brady Statistic AP VS Percent: 0.1 %
Brady Statistic AS VP Percent: 93.64 %
Brady Statistic AS VS Percent: 1.04 %
Brady Statistic RA Percent Paced: 5.33 %
Brady Statistic RV Percent Paced: 10.92 %
Date Time Interrogation Session: 20200924052203
HighPow Impedance: 76 Ohm
Implantable Lead Implant Date: 20190318
Implantable Lead Implant Date: 20190318
Implantable Lead Implant Date: 20190318
Implantable Lead Location: 753858
Implantable Lead Location: 753859
Implantable Lead Location: 753860
Implantable Lead Model: 293
Implantable Lead Model: 5076
Implantable Lead Serial Number: 442838
Implantable Pulse Generator Implant Date: 20190318
Lead Channel Impedance Value: 1026 Ohm
Lead Channel Impedance Value: 1026 Ohm
Lead Channel Impedance Value: 1026 Ohm
Lead Channel Impedance Value: 241.412
Lead Channel Impedance Value: 249.509
Lead Channel Impedance Value: 257.018
Lead Channel Impedance Value: 274.19 Ohm
Lead Channel Impedance Value: 284.683
Lead Channel Impedance Value: 399 Ohm
Lead Channel Impedance Value: 399 Ohm
Lead Channel Impedance Value: 399 Ohm
Lead Channel Impedance Value: 456 Ohm
Lead Channel Impedance Value: 513 Ohm
Lead Channel Impedance Value: 551 Ohm
Lead Channel Impedance Value: 589 Ohm
Lead Channel Impedance Value: 836 Ohm
Lead Channel Impedance Value: 893 Ohm
Lead Channel Impedance Value: 931 Ohm
Lead Channel Pacing Threshold Amplitude: 0.5 V
Lead Channel Pacing Threshold Amplitude: 0.75 V
Lead Channel Pacing Threshold Amplitude: 0.875 V
Lead Channel Pacing Threshold Pulse Width: 0.4 ms
Lead Channel Pacing Threshold Pulse Width: 0.4 ms
Lead Channel Pacing Threshold Pulse Width: 1 ms
Lead Channel Sensing Intrinsic Amplitude: 15.5 mV
Lead Channel Sensing Intrinsic Amplitude: 15.5 mV
Lead Channel Sensing Intrinsic Amplitude: 4.625 mV
Lead Channel Sensing Intrinsic Amplitude: 4.625 mV
Lead Channel Setting Pacing Amplitude: 1.25 V
Lead Channel Setting Pacing Amplitude: 2 V
Lead Channel Setting Pacing Amplitude: 2.5 V
Lead Channel Setting Pacing Pulse Width: 0.4 ms
Lead Channel Setting Pacing Pulse Width: 1 ms
Lead Channel Setting Sensing Sensitivity: 0.3 mV

## 2019-07-02 ENCOUNTER — Encounter: Payer: Self-pay | Admitting: Cardiology

## 2019-07-02 NOTE — Progress Notes (Signed)
Remote ICD transmission.   

## 2019-07-03 ENCOUNTER — Other Ambulatory Visit (HOSPITAL_COMMUNITY): Payer: Self-pay | Admitting: *Deleted

## 2019-07-03 ENCOUNTER — Encounter (HOSPITAL_COMMUNITY): Payer: Self-pay | Admitting: *Deleted

## 2019-07-03 DIAGNOSIS — R0683 Snoring: Secondary | ICD-10-CM

## 2019-07-11 ENCOUNTER — Telehealth (HOSPITAL_COMMUNITY): Payer: Self-pay | Admitting: *Deleted

## 2019-07-11 NOTE — Telephone Encounter (Signed)
Patient Name:  Joseph Parrish         DOB: 05/12/1966      Height:     Weight:  Office Name:         Referring Provider:  Today's Date:  Date:   STOP BANG RISK ASSESSMENT S (snore) Have you been told that you snore?     YES   T (tired) Are you often tired, fatigued, or sleepy during the day?   YES  O (obstruction) Do you stop breathing, choke, or gasp during sleep? YES   P (pressure) Do you have or are you being treated for high blood pressure? YES   B (BMI) Is your body index greater than 35 kg/m? NO   A (age) Are you 60 years old or older? YES   N (neck) Do you have a neck circumference greater than 16 inches?   NO   G (gender) Are you a male? YES   TOTAL STOP/BANG "YES" ANSWERS 6                                                                       For Office Use Only              Procedure Order Form    YES to 3+ Stop Bang questions OR two clinical symptoms - patient qualifies for WatchPAT (CPT 95800)     Submit: This Form + Patient Face Sheet + Clinical Note via Cloud[employer] or Fax: [phone]         Clinical Notes: Will consult Sleep Specialist and refer for management of therapy due to patient increased risk of Sleep Apnea. Ordering a sleep study due to the following two clinical symptoms: Excessive daytime sleepiness G47.10 / Gastroesophageal reflux K21.9 / Nocturia R35.1 / Morning Headaches G44.221 / Difficulty concentrating R41.840 / Memory problems or poor judgment G31.84 / Personality changes or irritability R45.4 / Loud snoring R06.83 / Depression F32.9 / Unrefreshed by sleep G47.8 / Impotence N52.9 / History of high blood pressure R03.0 / Insomnia G47.00    I understand that I am proceeding with a home sleep apnea test as ordered by my treating physician. I understand that untreated sleep apnea is a serious cardiovascular risk factor and it is my responsibility to perform the test and seek management for sleep apnea. I will be contacted with the results and be  managed for sleep apnea by a local sleep physician. I will be receiving equipment and further instructions from [facility]. I shall promptly ship back the equipment via the included mailing label. I understand my insurance will be billed for the test and as the patient I am responsible for any insurance related out-of-pocket costs incurred. I have been provided with written instructions and can call for additional video or telephonic instruction, with 24-hour availability of qualified personnel to answer any questions: Patient Help Desk [phone].  Patient Signature ______________________________________________________   Date______________________ Patient Telemedicine Verbal Consent

## 2019-07-13 ENCOUNTER — Other Ambulatory Visit (HOSPITAL_COMMUNITY): Payer: Self-pay | Admitting: Internal Medicine

## 2019-07-17 DIAGNOSIS — D2261 Melanocytic nevi of right upper limb, including shoulder: Secondary | ICD-10-CM | POA: Diagnosis not present

## 2019-07-17 DIAGNOSIS — L570 Actinic keratosis: Secondary | ICD-10-CM | POA: Diagnosis not present

## 2019-07-17 DIAGNOSIS — L218 Other seborrheic dermatitis: Secondary | ICD-10-CM | POA: Diagnosis not present

## 2019-07-17 DIAGNOSIS — B351 Tinea unguium: Secondary | ICD-10-CM | POA: Diagnosis not present

## 2019-07-17 DIAGNOSIS — D225 Melanocytic nevi of trunk: Secondary | ICD-10-CM | POA: Diagnosis not present

## 2019-07-30 ENCOUNTER — Telehealth (HOSPITAL_COMMUNITY): Payer: Self-pay

## 2019-07-30 ENCOUNTER — Other Ambulatory Visit (HOSPITAL_COMMUNITY): Payer: Self-pay

## 2019-07-30 NOTE — Telephone Encounter (Signed)
Faxed Order and sleep study to [facility] @ [phone] on 07/30/2019.

## 2019-08-11 ENCOUNTER — Encounter (INDEPENDENT_AMBULATORY_CARE_PROVIDER_SITE_OTHER): Payer: BC Managed Care – PPO | Admitting: Cardiology

## 2019-08-11 DIAGNOSIS — G4733 Obstructive sleep apnea (adult) (pediatric): Secondary | ICD-10-CM | POA: Diagnosis not present

## 2019-08-11 DIAGNOSIS — R0683 Snoring: Secondary | ICD-10-CM | POA: Diagnosis not present

## 2019-08-15 ENCOUNTER — Telehealth (HOSPITAL_COMMUNITY): Payer: Self-pay | Admitting: Surgery

## 2019-08-15 NOTE — Telephone Encounter (Signed)
I contacted patient regarding pending sleep study and equipment return.  He tells me that he has completed study and uploaded it.  He has also "thrown away equipment as instructed".

## 2019-08-19 NOTE — Procedures (Signed)
    Sleep Study Report  Patient Information Name: Joseph Parrish ID: [id number] Birth Date: Feb 24, 1966  Age: 53  Gender: Male BMI: 32.7 (W=216 lb, H=5' 8'') Study Date:08/11/2019 Referring Physician:  Pierre Bali, MD  TEST DESCRIPTION: Home sleep apnea testing was completed using the WatchPat, a Type 1 device, utilizing peripheral arterial tonometry (PAT), chest movement, actigraphy, pulse oximetry, pulse rate, body position and snore. AHI was calculated with apnea and hypopnea using valid sleep time as the denominator. RDI includes apneas, hypopneas, and RERAs. The data acquired and the scoring of sleep and all associated events were performed in accordance with the recommended standards and specifications as outlined in the AASM Manual for the Scoring of Sleep and Associated Events 2.2.0 (2015).  FINDINGS: 1. Moderate Obstructive Sleep Apnea with AHI 18.7/hr. 2. No significant Central Sleep Apnea. The pAHIc was 1/hr. 3. Mild to moderate snoring was present. 4. Severe Oxygen desaturations as low as 71%. Nocturnal hypoxemia with time spent with O2 sats < 88% was 39.3 minutes. 5. Total sleep time was 6 hours and 4 minutes. 6. Short sleep onset latency at 6 minutes. 7. Shortened REM sleep onset latency at 37 minutes. 8. The number of awakenings was 8.  DIAGNOSIS:  Moderate Obstructive Sleep Apnea (G47.33)  Nocturnal Hypoxemia  RECOMMENDATIONS: 1. Findings are consistent with moderate OSA. For symptomatic moderate obstructive sleep apnea, patient preference and compliance impacts efficacious outcomes. Therapeutic options include:   a. The patient may benefit from the use of a nocturnal mandibular repositioning appliance. If that line of therapy is to be pursued the patient should be evaluated by a dentist trained in the treatment of sleep related breathing disorders.   b. An ENT consultation which may be useful for specific causes of obstruction and possible treatment options  .   c. Consider treatment with nasal continuous positive airway pressure ( CPAP). If the patient chooses CPAP therapy, a nocturnal PSG with CPAP titration is recommended. As an alternative, an Auto PAP with pressure rangev5-20cm H2O with download is an option.  2. Weight loss may be of benefit in reducing the severity of respiratory events and snoring .  3. The patient should be counseled to avoid sleeping in the supine position.  4. The patient should be counseled in good sleep hygiene.  5. Routine follow-up efficacy testing should be performed.  Report prepared by: Signature: Fransico Him Electronically Signed: Nov 22, 202

## 2019-08-22 ENCOUNTER — Telehealth: Payer: Self-pay | Admitting: *Deleted

## 2019-08-22 NOTE — Telephone Encounter (Signed)
-----   Message from Sueanne Margarita, MD sent at 08/19/2019  5:13 PM EST ----- Please let patient know that they have sleep apnea and recommend auto CPAP titration through [facility].  Orders have been placed in Epic. Please set 10 week OV with me.

## 2019-08-22 NOTE — Telephone Encounter (Signed)
Informed patient of sleep study results and patient understanding was verbalized. Patient understands his sleep study showed they have sleep apnea and recommend auto CPAP titration through [facility]. Orders have been placed in Epic. Please set 10 week OV with me.  Pt is aware and agreeable to his results.  Upon patient request DME selection is [facility] Patient understands he will be contacted by [facility] to set up his cpap. Patient understands to call if [facility] does not contact him with new setup in a timely manner. Patient understands they will be called once confirmation has been received from [facility] that they have received their new machine to schedule 10 week follow up appointment.  [facility] notified of new cpap order BY ELECTRONIC FAX  Please add to airview Patient was grateful for the call and thanked me.

## 2019-08-31 ENCOUNTER — Telehealth (HOSPITAL_COMMUNITY): Payer: Self-pay

## 2019-08-31 NOTE — Telephone Encounter (Signed)
Received notification from [facility] that the Patients' Sleep Study is in Pending status.

## 2019-09-01 DIAGNOSIS — R0683 Snoring: Secondary | ICD-10-CM | POA: Diagnosis not present

## 2019-09-01 DIAGNOSIS — G4733 Obstructive sleep apnea (adult) (pediatric): Secondary | ICD-10-CM | POA: Diagnosis not present

## 2019-09-01 DIAGNOSIS — I10 Essential (primary) hypertension: Secondary | ICD-10-CM | POA: Diagnosis not present

## 2019-09-12 ENCOUNTER — Telehealth: Payer: Self-pay | Admitting: *Deleted

## 2019-09-12 NOTE — Telephone Encounter (Signed)

## 2019-09-12 NOTE — Telephone Encounter (Signed)
Patient has a 10 week follow up appointment scheduled for 10/31/19. Patient understands he needs to keep this appointment for insurance compliance. Patient was grateful for the call and thanked me.

## 2019-09-20 ENCOUNTER — Ambulatory Visit (INDEPENDENT_AMBULATORY_CARE_PROVIDER_SITE_OTHER): Payer: BC Managed Care – PPO | Admitting: *Deleted

## 2019-09-20 DIAGNOSIS — I428 Other cardiomyopathies: Secondary | ICD-10-CM

## 2019-09-21 LAB — CUP PACEART REMOTE DEVICE CHECK
Battery Remaining Longevity: 89 mo
Battery Voltage: 2.99 V
Brady Statistic AP VP Percent: 3.06 %
Brady Statistic AP VS Percent: 0.07 %
Brady Statistic AS VP Percent: 95.71 %
Brady Statistic AS VS Percent: 1.15 %
Brady Statistic RA Percent Paced: 3.13 %
Brady Statistic RV Percent Paced: 8.33 %
Date Time Interrogation Session: 20201224033323
HighPow Impedance: 65 Ohm
Implantable Lead Implant Date: 20190318
Implantable Lead Implant Date: 20190318
Implantable Lead Implant Date: 20190318
Implantable Lead Location: 753858
Implantable Lead Location: 753859
Implantable Lead Location: 753860
Implantable Lead Model: 293
Implantable Lead Model: 5076
Implantable Lead Serial Number: 442838
Implantable Pulse Generator Implant Date: 20190318
Lead Channel Impedance Value: 1007 Ohm
Lead Channel Impedance Value: 230.327
Lead Channel Impedance Value: 237.686
Lead Channel Impedance Value: 244.491
Lead Channel Impedance Value: 274.19 Ohm
Lead Channel Impedance Value: 284.683
Lead Channel Impedance Value: 361 Ohm
Lead Channel Impedance Value: 361 Ohm
Lead Channel Impedance Value: 361 Ohm
Lead Channel Impedance Value: 418 Ohm
Lead Channel Impedance Value: 513 Ohm
Lead Channel Impedance Value: 551 Ohm
Lead Channel Impedance Value: 589 Ohm
Lead Channel Impedance Value: 760 Ohm
Lead Channel Impedance Value: 836 Ohm
Lead Channel Impedance Value: 874 Ohm
Lead Channel Impedance Value: 893 Ohm
Lead Channel Impedance Value: 988 Ohm
Lead Channel Pacing Threshold Amplitude: 0.5 V
Lead Channel Pacing Threshold Amplitude: 0.75 V
Lead Channel Pacing Threshold Amplitude: 0.875 V
Lead Channel Pacing Threshold Pulse Width: 0.4 ms
Lead Channel Pacing Threshold Pulse Width: 0.4 ms
Lead Channel Pacing Threshold Pulse Width: 1 ms
Lead Channel Sensing Intrinsic Amplitude: 16.75 mV
Lead Channel Sensing Intrinsic Amplitude: 16.75 mV
Lead Channel Sensing Intrinsic Amplitude: 4.375 mV
Lead Channel Sensing Intrinsic Amplitude: 4.375 mV
Lead Channel Setting Pacing Amplitude: 1.25 V
Lead Channel Setting Pacing Amplitude: 2 V
Lead Channel Setting Pacing Amplitude: 2.5 V
Lead Channel Setting Pacing Pulse Width: 0.4 ms
Lead Channel Setting Pacing Pulse Width: 1 ms
Lead Channel Setting Sensing Sensitivity: 0.3 mV

## 2019-10-30 NOTE — Progress Notes (Signed)
Virtual Visit via Telephone Note   This visit type was conducted due to national recommendations for restrictions regarding the COVID-19 Pandemic (e.g. social distancing) in an effort to limit this patient's exposure and mitigate transmission in our community.  Due to his co-morbid illnesses, this patient is at least at moderate risk for complications without adequate follow up.  This format is felt to be most appropriate for this patient at this time.  All issues noted in this document were discussed and addressed.  A limited physical exam was performed with this format.  Please refer to the patient's chart for his consent to telehealth for [facility].   Evaluation Performed:  Follow-up visit  This visit type was conducted due to national recommendations for restrictions regarding the COVID-19 Pandemic (e.g. social distancing).  This format is felt to be most appropriate for this patient at this time.  All issues noted in this document were discussed and addressed.  No physical exam was performed (except for noted visual exam findings with Video Visits).  Please refer to the patient's chart (MyChart message for video visits and phone note for telephone visits) for the patient's consent to telehealth for [facility].  Date:  10/31/2019   ID:  Joseph Parrish, DOB May 22, 1966, MRN [id number]  Patient Location:  Home  Provider location:   [place]  PCP:  Sueanne Margarita, MD  Cardiologist:  Glori Bickers, MD  Sleep Medicine:  Fransico Him, MD Electrophysiologist:  None   Chief Complaint:  OSA  History of Present Illness:    Joseph Parrish is a 54 y.o. male who presents via audio/video conferencing for a telehealth visit today.    This is a 54yo obese male with a hx of HTN, CHF, GERD and had a sleep study in 2018 showing no significant OSA with AHI 4.8/hr but significant OSA in REM sleep with AHI 21.6/hr.  CPAP was ordered but never started. He tried the nasal mask at the time  of PAP titration but felt very claustraphobic. He tried an oral device with little benefit.   He was referred back by Dr. Haroldine Laws due to excessive daytime sleepiness feeling tired a lot.  He went back to Dr. Haroldine Laws and decided to try the CPAP. He underwent a home sleep study which showed Moderate OSA with an AHI of 18.7/hr with no significant central sleep apnea and nocturnal hypoxemia with time spent with O2 sats< 88% at 39 minutes.  He was started on Auto CPAP and is now here for followup.    He is doing well with his CPAP device and thinks that he has gotten used to it.  He tolerates the nasal cradle mask and feels the pressure is adequate.  Since going on CPAP he feels rested in the am and has no significant daytime sleepiness.  He denies any significant mouth or nasal dryness or nasal congestion.  He does not think that he snores.    The patient does not have symptoms concerning for COVID-19 infection (fever, chills, cough, or new shortness of breath).   Prior CV studies:   The following studies were reviewed today:  PSG 2018, home sleep study 2020 and PAP compliance download  Past Medical History:  Diagnosis Date  . Allergy   . CHF (congestive heart failure) (HCC)    cardiomyopathy  . Cholecystitis   . Cholelithiasis   . Diverticulosis   . GERD (gastroesophageal reflux disease)    diet   . Gout   . Headache  migraines  . History of kidney stones feb 2018 passed   . Hypertension   . Sleep apnea    new dx sept 2018 looking at dental appliances for mild osa   Past Surgical History:  Procedure Laterality Date  . BIV ICD INSERTION CRT-D N/A 12/12/2017   Procedure: BIV ICD INSERTION CRT-D;  Surgeon: Deboraha Sprang, MD;  Location: [place] [facility];  Service: Cardiovascular;  Laterality: N/A;  . CHOLECYSTECTOMY  01/2017  . COLONOSCOPY WITH PROPOFOL N/A 07/26/2017   Procedure: COLONOSCOPY WITH PROPOFOL;  Surgeon: Jerene Bears, MD;  Location: [facility];  Service:  Gastroenterology;  Laterality: N/A;  . LEFT HEART CATH AND CORONARY ANGIOGRAPHY N/A 04/08/2017   Procedure: Left Heart Cath and Coronary Angiography;  Surgeon: Jolaine Artist, MD;  Location: [place] [facility];  Service: Cardiovascular;  Laterality: N/A;  . TONSILLECTOMY AND ADENOIDECTOMY       Current Meds  Medication Sig  . acetaminophen (TYLENOL) 500 MG tablet Take 500-1,000 mg by mouth every 6 (six) hours as needed for moderate pain or headache.   . carvedilol (COREG) 12.5 MG tablet TAKE 1 TABLET (12.5 MG TOTAL) BY MOUTH 2 (TWO) TIMES DAILY WITH A MEAL.  Marland Kitchen colchicine 0.6 MG tablet Take 0.6 mg daily as needed by mouth (for gout flares).  . furosemide (LASIX) 20 MG tablet TAKE 1 TABLET (20 MG TOTAL) BY MOUTH AS NEEDED. FOR SWELLING.  Marland Kitchen ibuprofen (ADVIL,MOTRIN) 200 MG tablet Take 600 mg by mouth every 8 (eight) hours as needed for headache or moderate pain.   Marland Kitchen loratadine (CLARITIN) 10 MG tablet Take 10 mg by mouth at bedtime.   Marland Kitchen losartan (COZAAR) 100 MG tablet TAKE 1 TABLET BY MOUTH EVERY DAY  . protriptyline (VIVACTIL) 10 MG tablet Take 10 mg by mouth at bedtime.     Allergies:   Patient has no known allergies.   Social History   Tobacco Use  . Smoking status: Never Smoker  . Smokeless tobacco: Never Used  Substance Use Topics  . Alcohol use: Yes    Alcohol/week: 0.0 standard drinks    Comment: socially  . Drug use: No     Family Hx: The patient's family history includes Aneurysm (age of onset: 76) in his father; Stroke in his paternal grandfather. There is no history of Colon cancer, Colon polyps, Esophageal cancer, Rectal cancer, or Stomach cancer.  ROS:   Please see the history of present illness.     All other systems reviewed and are negative.   Labs/Other Tests and Data Reviewed:    Recent Labs: 04/04/2019: B Natriuretic Peptide 17.4; BUN 17; Creatinine, Ser 1.25; Hemoglobin 15.6; Platelets 264; Potassium 4.6; Sodium 139   Recent Lipid Panel No results found  for: CHOL, TRIG, HDL, CHOLHDL, LDLCALC, LDLDIRECT  Wt Readings from Last 3 Encounters:  10/31/19 210 lb (95.3 kg)  04/04/19 216 lb (98 kg)  12/05/18 207 lb 9.6 oz (94.2 kg)     Objective:    Vital Signs:  Pulse 70   Ht 5\' 8"  (1.727 m)   Wt 210 lb (95.3 kg)   BMI 31.93 kg/m     ASSESSMENT & PLAN:    1.  OSA - The pathophysiology of obstructive sleep apnea , it's cardiovascular consequences & modes of treatment including CPAP were discused with the patient in detail & they evidenced understanding.  The patient is tolerating PAP therapy well without any problems. The PAP download was reviewed today and showed an AHI of  1/hr on auto PAP with 100% compliance in using more than 4 hours nightly.  The patient has been using and benefiting from PAP use and will continue to benefit from therapy.   2.  Obesity -I have encouraged him to get into a routine exercise program and cut back on carbs and portions.   3.  HTN -continue Losartan 100mg  daily and Carvedilol 12.5mg  BID   COVID-19 Education: The signs and symptoms of COVID-19 were discussed with the patient and how to seek care for testing (follow up with PCP or arrange E-visit).  The importance of social distancing was discussed today.  Patient Risk:   After full review of this patient's clinical status, I feel that they are at least moderate risk at this time.  Time:   Today, I have spent 20 minutes directly with the patient on Telemedicine discussing medical problems including OSA, obesity, HTN.  We also reviewed the symptoms of COVID 19 and the ways to protect against contracting the virus with telehealth technology.  I spent an additional 10 minutes reviewing patient's chart including PSG 2018, home sleep study 2020 and PAP compliance download.  Medication Adjustments/Labs and Tests Ordered: Current medicines are reviewed at length with the patient today.  Concerns regarding medicines are outlined above.  Tests Ordered: No orders  of the defined types were placed in this encounter.  Medication Changes: No orders of the defined types were placed in this encounter.   Disposition:  Follow up in 1 year(s)  Signed, Fransico Him, MD  10/31/2019 1:24 PM    [facility]

## 2019-10-31 ENCOUNTER — Telehealth (INDEPENDENT_AMBULATORY_CARE_PROVIDER_SITE_OTHER): Payer: BC Managed Care – PPO | Admitting: Cardiology

## 2019-10-31 ENCOUNTER — Other Ambulatory Visit: Payer: Self-pay

## 2019-10-31 ENCOUNTER — Encounter: Payer: Self-pay | Admitting: Cardiology

## 2019-10-31 VITALS — HR 70 | Ht 68.0 in | Wt 210.0 lb

## 2019-10-31 DIAGNOSIS — G4733 Obstructive sleep apnea (adult) (pediatric): Secondary | ICD-10-CM | POA: Diagnosis not present

## 2019-10-31 DIAGNOSIS — E669 Obesity, unspecified: Secondary | ICD-10-CM

## 2019-10-31 DIAGNOSIS — I10 Essential (primary) hypertension: Secondary | ICD-10-CM

## 2019-10-31 NOTE — Patient Instructions (Signed)
Medication Instructions:  °Your physician recommends that you continue on your current medications as directed. Please refer to the Current Medication list given to you today. ° °*If you need a refill on your cardiac medications before your next appointment, please call your pharmacy* ° °Follow-Up: °At [facility], you and your health needs are our priority.  As part of our continuing mission to provide you with exceptional heart care, we have created designated Provider Care Teams.  These Care Teams include your primary Cardiologist (physician) and Advanced Practice Providers (APPs -  Physician Assistants and Nurse Practitioners) who all work together to provide you with the care you need, when you need it. ° ° °Your next appointment:   °1 year(s) ° °The format for your next appointment:   °Either In Person or Virtual ° °Provider:   °You may see Traci Turner, MD or one of the following Advanced Practice Providers on your designated Care Team:   °· Dayna Dunn, PA-C °· Michele Lenze, PA-C ° ° °

## 2019-11-30 DIAGNOSIS — I10 Essential (primary) hypertension: Secondary | ICD-10-CM | POA: Diagnosis not present

## 2019-11-30 DIAGNOSIS — R0683 Snoring: Secondary | ICD-10-CM | POA: Diagnosis not present

## 2019-11-30 DIAGNOSIS — G4733 Obstructive sleep apnea (adult) (pediatric): Secondary | ICD-10-CM | POA: Diagnosis not present

## 2019-12-10 DIAGNOSIS — Z95810 Presence of automatic (implantable) cardiac defibrillator: Secondary | ICD-10-CM | POA: Insufficient documentation

## 2019-12-11 ENCOUNTER — Ambulatory Visit: Payer: BC Managed Care – PPO | Admitting: Internal Medicine

## 2019-12-11 ENCOUNTER — Encounter: Payer: Self-pay | Admitting: Internal Medicine

## 2019-12-11 ENCOUNTER — Other Ambulatory Visit: Payer: Self-pay

## 2019-12-11 VITALS — BP 110/82 | HR 60 | Ht 68.0 in | Wt 217.0 lb

## 2019-12-11 DIAGNOSIS — Z95810 Presence of automatic (implantable) cardiac defibrillator: Secondary | ICD-10-CM

## 2019-12-11 DIAGNOSIS — I428 Other cardiomyopathies: Secondary | ICD-10-CM

## 2019-12-11 DIAGNOSIS — I447 Left bundle-branch block, unspecified: Secondary | ICD-10-CM

## 2019-12-11 DIAGNOSIS — I5022 Chronic systolic (congestive) heart failure: Secondary | ICD-10-CM | POA: Diagnosis not present

## 2019-12-11 DIAGNOSIS — R0602 Shortness of breath: Secondary | ICD-10-CM

## 2019-12-11 MED ORDER — CARVEDILOL 12.5 MG PO TABS: 6.2500 mg | ORAL_TABLET | Freq: Two times a day (BID) | ORAL | 3 refills | Status: DC

## 2019-12-11 NOTE — Progress Notes (Signed)
Patient Care Team: Sueanne Margarita, MD as PCP - General (Cardiology) Bensimhon, Shaune Pascal, MD as PCP - Cardiology (Cardiology)   HPI  Joseph Parrish is a 54 y.o. male Seen in follow-up for CRT-D for nonischemic cardiomyopathy and left bundle branch block.  The patient denies chest pain, shortness of breath, nocturnal dyspnea, orthopnea or peripheral edema.  There have been no palpitations, lightheadednes or syncope  Seen today as effCRT number on interrogation was significantly depressed for reasons not clear discordant from PVC count  The patient has any significant changes in exercise tolerance over the last year.  In the context of Covid he is less active but he is still going to [employer] 3 times a week.  He is struggling to keep up.  No nocturnal dyspnea orthopnea or peripheral edema   DATE TEST EF   6/18 Echo   25 %   7/18 Cath    40 % Normal CA  11/18 cMRI  26 LGE neg  6/19 Echo  45-50%     Date Cr K Hgb  3/19 1.39 4.8 15.8           Past Medical History:  Diagnosis Date  . Allergy   . CHF (congestive heart failure) (HCC)    cardiomyopathy  . Cholecystitis   . Cholelithiasis   . Diverticulosis   . GERD (gastroesophageal reflux disease)    diet   . Gout   . Headache    migraines  . History of kidney stones feb 2018 passed   . Hypertension   . Sleep apnea    new dx sept 2018 looking at dental appliances for mild osa    Past Surgical History:  Procedure Laterality Date  . BIV ICD INSERTION CRT-D N/A 12/12/2017   Procedure: BIV ICD INSERTION CRT-D;  Surgeon: Deboraha Sprang, MD;  Location: [place] [facility];  Service: Cardiovascular;  Laterality: N/A;  . CHOLECYSTECTOMY  01/2017  . COLONOSCOPY WITH PROPOFOL N/A 07/26/2017   Procedure: COLONOSCOPY WITH PROPOFOL;  Surgeon: Jerene Bears, MD;  Location: [facility];  Service: Gastroenterology;  Laterality: N/A;  . LEFT HEART CATH AND CORONARY ANGIOGRAPHY N/A 04/08/2017   Procedure: Left Heart Cath and  Coronary Angiography;  Surgeon: Jolaine Artist, MD;  Location: [place] [facility];  Service: Cardiovascular;  Laterality: N/A;  . TONSILLECTOMY AND ADENOIDECTOMY      Current Meds  Medication Sig  . acetaminophen (TYLENOL) 500 MG tablet Take 500-1,000 mg by mouth every 6 (six) hours as needed for moderate pain or headache.   . carvedilol (COREG) 12.5 MG tablet TAKE 1 TABLET (12.5 MG TOTAL) BY MOUTH 2 (TWO) TIMES DAILY WITH A MEAL.  Marland Kitchen colchicine 0.6 MG tablet Take 0.6 mg daily as needed by mouth (for gout flares).  Marland Kitchen ibuprofen (ADVIL,MOTRIN) 200 MG tablet Take 600 mg by mouth every 8 (eight) hours as needed for headache or moderate pain.   Marland Kitchen loratadine (CLARITIN) 10 MG tablet Take 10 mg by mouth at bedtime.   Marland Kitchen losartan (COZAAR) 100 MG tablet TAKE 1 TABLET BY MOUTH EVERY DAY    No Known Allergies    Review of Systems negative except from HPI and PMH  Physical Exam BP 110/82   Pulse 60   Ht 5\' 8"  (1.727 m)   Wt 217 lb (98.4 kg)   SpO2 97%   BMI 32.99 kg/m  Well developed and well nourished in no acute distress HENT normal Neck supple with JVP-flat  Clear Device pocket well healed; without hematoma or erythema.  There is no tethering  Regular rate and rhythm, no  murmur Abd-soft with active BS No Clubbing cyanosis  edema Skin-warm and dry A & Oriented  Grossly normal sensory and motor function  ECG P-synchronous/ AV  pacing with changing QRS complex V1 rS>>R    Assessment and  Plan  LBBB  NICM  PVCs 10 % >= ( does not include run)- ' CRT -[employer] QRS is much shorter with aCRT than Biv   Sinus node dysfunction  CHF chronic systolic     He has noted increasing problems with exercise tolerance.  Device interrogation shows a significant diminution in heart beats faster than 100 bpm raising a possibility of chronotropic incompetence as a consequence of his underlying cardiomyopathy.  We will decrease his carvedilol from 12.5--6.25.  Interestingly, when he  walked the stairs he was able to generate a heart rate of 115.  Submitted for treadmill testing will be informing as to his heart rate excursion.  I need help from [employer].  His electrocardiogram shows a change in the pacing configuration in lead V1 without changes in heart rate.  The algorithm for Bi V and LV adaptive pacing is supposed to transition in the 100 bpm.  There is a nuance I am missing.   Moreover, looking at the event recorder from 1/20 there are multiple QRS morphologies.  He would be expected to have a possibility of 1 intrinsic conduction, 2V sensed response, 3 adaptive LV, 4 adaptive BiV. I am not sure that I am understanding the switching of the pacing mechanisms.  Again we will reach out to [employer].  We will look at his echocardiogram and anticipate, if his EF remains low, changing from losartan to [facility].  He may benefit also from spironolactone.

## 2019-12-11 NOTE — Patient Instructions (Signed)
Medication Instructions:  Your physician has recommended you make the following change in your medication:   ** Decrease your Carvedilol to 1/2 tablet (6.25mg ) by mouth twice daily.  Labwork: None ordered.  Testing/Procedures: Your physician has requested that you have an echocardiogram. Echocardiography is a painless test that uses sound waves to create images of your heart. It provides your doctor with information about the size and shape of your heart and how well your heart's chambers and valves are working. This procedure takes approximately one hour. There are no restrictions for this procedure.  Your physician has requested that you have an exercise tolerance test. For further information please visit [email or web address]. Please also follow instruction sheet, as given.   Follow-Up: Your physician wants you to follow-up in: 12 months with Dr Caryl Comes. You will receive a reminder letter in the mail two months in advance. If you don't receive a letter, please call our office to schedule the follow-up appointment.  Remote monitoring is used to monitor your Pacemaker of ICD from home. This monitoring reduces the number of office visits required to check your device to one time per year. It allows [place] to keep an eye on the functioning of your device to ensure it is working properly.   Any Other Special Instructions Will Be Listed Below (If Applicable).  If you need a refill on your cardiac medications before your next appointment, please call your pharmacy.

## 2019-12-14 ENCOUNTER — Encounter: Payer: Self-pay | Admitting: Internal Medicine

## 2019-12-14 LAB — CUP PACEART INCLINIC DEVICE CHECK
Battery Remaining Longevity: 86 mo
Battery Voltage: 2.99 V
Brady Statistic AP VP Percent: 3.66 %
Brady Statistic AP VS Percent: 0.08 %
Brady Statistic AS VP Percent: 95.16 %
Brady Statistic AS VS Percent: 1.1 %
Brady Statistic RA Percent Paced: 3.74 %
Brady Statistic RV Percent Paced: 9.58 %
Date Time Interrogation Session: 20210316161600
HighPow Impedance: 75 Ohm
Implantable Lead Implant Date: 20190318
Implantable Lead Implant Date: 20190318
Implantable Lead Implant Date: 20190318
Implantable Lead Location: 753858
Implantable Lead Location: 753859
Implantable Lead Location: 753860
Implantable Lead Model: 293
Implantable Lead Model: 5076
Implantable Lead Serial Number: 442838
Implantable Pulse Generator Implant Date: 20190318
Lead Channel Impedance Value: 230.327
Lead Channel Impedance Value: 234.08 Ohm
Lead Channel Impedance Value: 247.704
Lead Channel Impedance Value: 278.237
Lead Channel Impedance Value: 283.733
Lead Channel Impedance Value: 361 Ohm
Lead Channel Impedance Value: 399 Ohm
Lead Channel Impedance Value: 418 Ohm
Lead Channel Impedance Value: 475 Ohm
Lead Channel Impedance Value: 513 Ohm
Lead Channel Impedance Value: 532 Ohm
Lead Channel Impedance Value: 608 Ohm
Lead Channel Impedance Value: 779 Ohm
Lead Channel Impedance Value: 874 Ohm
Lead Channel Impedance Value: 874 Ohm
Lead Channel Impedance Value: 950 Ohm
Lead Channel Impedance Value: 988 Ohm
Lead Channel Impedance Value: 988 Ohm
Lead Channel Pacing Threshold Amplitude: 0.5 V
Lead Channel Pacing Threshold Amplitude: 0.75 V
Lead Channel Pacing Threshold Amplitude: 1 V
Lead Channel Pacing Threshold Pulse Width: 0.4 ms
Lead Channel Pacing Threshold Pulse Width: 0.4 ms
Lead Channel Pacing Threshold Pulse Width: 1 ms
Lead Channel Sensing Intrinsic Amplitude: 13.875 mV
Lead Channel Sensing Intrinsic Amplitude: 4.375 mV
Lead Channel Setting Pacing Amplitude: 1.25 V
Lead Channel Setting Pacing Amplitude: 2 V
Lead Channel Setting Pacing Amplitude: 2.5 V
Lead Channel Setting Pacing Pulse Width: 0.4 ms
Lead Channel Setting Pacing Pulse Width: 1 ms
Lead Channel Setting Sensing Sensitivity: 0.3 mV

## 2019-12-20 ENCOUNTER — Ambulatory Visit (INDEPENDENT_AMBULATORY_CARE_PROVIDER_SITE_OTHER): Payer: BC Managed Care – PPO | Admitting: *Deleted

## 2019-12-20 DIAGNOSIS — I428 Other cardiomyopathies: Secondary | ICD-10-CM | POA: Diagnosis not present

## 2019-12-20 LAB — CUP PACEART REMOTE DEVICE CHECK
Battery Remaining Longevity: 85 mo
Battery Voltage: 2.99 V
Brady Statistic AP VP Percent: 0.05 %
Brady Statistic AP VS Percent: 0.01 %
Brady Statistic AS VP Percent: 98.62 %
Brady Statistic AS VS Percent: 1.32 %
Brady Statistic RA Percent Paced: 0.06 %
Brady Statistic RV Percent Paced: 3.01 %
Date Time Interrogation Session: 20210325033526
HighPow Impedance: 68 Ohm
Implantable Lead Implant Date: 20190318
Implantable Lead Implant Date: 20190318
Implantable Lead Implant Date: 20190318
Implantable Lead Location: 753858
Implantable Lead Location: 753859
Implantable Lead Location: 753860
Implantable Lead Model: 293
Implantable Lead Model: 5076
Implantable Lead Serial Number: 442838
Implantable Pulse Generator Implant Date: 20190318
Lead Channel Impedance Value: 1007 Ohm
Lead Channel Impedance Value: 1026 Ohm
Lead Channel Impedance Value: 230.327
Lead Channel Impedance Value: 237.686
Lead Channel Impedance Value: 247.704
Lead Channel Impedance Value: 278.237
Lead Channel Impedance Value: 289.049
Lead Channel Impedance Value: 361 Ohm
Lead Channel Impedance Value: 361 Ohm
Lead Channel Impedance Value: 418 Ohm
Lead Channel Impedance Value: 456 Ohm
Lead Channel Impedance Value: 513 Ohm
Lead Channel Impedance Value: 551 Ohm
Lead Channel Impedance Value: 608 Ohm
Lead Channel Impedance Value: 760 Ohm
Lead Channel Impedance Value: 874 Ohm
Lead Channel Impedance Value: 874 Ohm
Lead Channel Impedance Value: 950 Ohm
Lead Channel Pacing Threshold Amplitude: 0.5 V
Lead Channel Pacing Threshold Amplitude: 0.75 V
Lead Channel Pacing Threshold Amplitude: 0.875 V
Lead Channel Pacing Threshold Pulse Width: 0.4 ms
Lead Channel Pacing Threshold Pulse Width: 0.4 ms
Lead Channel Pacing Threshold Pulse Width: 1 ms
Lead Channel Sensing Intrinsic Amplitude: 13.875 mV
Lead Channel Sensing Intrinsic Amplitude: 13.875 mV
Lead Channel Sensing Intrinsic Amplitude: 4.375 mV
Lead Channel Sensing Intrinsic Amplitude: 4.375 mV
Lead Channel Setting Pacing Amplitude: 1.25 V
Lead Channel Setting Pacing Amplitude: 2 V
Lead Channel Setting Pacing Amplitude: 2.5 V
Lead Channel Setting Pacing Pulse Width: 0.4 ms
Lead Channel Setting Pacing Pulse Width: 1 ms
Lead Channel Setting Sensing Sensitivity: 0.6 mV

## 2019-12-21 NOTE — Progress Notes (Signed)
ICD Remote  

## 2019-12-28 ENCOUNTER — Other Ambulatory Visit (HOSPITAL_COMMUNITY)
Admission: RE | Admit: 2019-12-28 | Discharge: 2019-12-28 | Disposition: A | Discharge status: Home / Self Care | Payer: BC Managed Care – PPO | Source: Ambulatory Visit | Attending: Internal Medicine | Admitting: Internal Medicine

## 2019-12-28 DIAGNOSIS — Z01812 Encounter for preprocedural laboratory examination: Secondary | ICD-10-CM | POA: Insufficient documentation

## 2019-12-28 DIAGNOSIS — Z20822 Contact with and (suspected) exposure to covid-19: Secondary | ICD-10-CM | POA: Insufficient documentation

## 2019-12-28 LAB — SARS CORONAVIRUS 2 (TAT 6-24 HRS): SARS Coronavirus 2: NEGATIVE

## 2019-12-31 DIAGNOSIS — G4733 Obstructive sleep apnea (adult) (pediatric): Secondary | ICD-10-CM | POA: Diagnosis not present

## 2019-12-31 DIAGNOSIS — R0683 Snoring: Secondary | ICD-10-CM | POA: Diagnosis not present

## 2019-12-31 DIAGNOSIS — I10 Essential (primary) hypertension: Secondary | ICD-10-CM | POA: Diagnosis not present

## 2020-01-01 ENCOUNTER — Ambulatory Visit: Payer: BC Managed Care – PPO

## 2020-01-01 ENCOUNTER — Ambulatory Visit (HOSPITAL_COMMUNITY): Payer: BC Managed Care – PPO | Attending: Cardiovascular Disease

## 2020-01-01 ENCOUNTER — Telehealth: Payer: Self-pay

## 2020-01-01 ENCOUNTER — Other Ambulatory Visit: Payer: Self-pay

## 2020-01-01 DIAGNOSIS — I428 Other cardiomyopathies: Secondary | ICD-10-CM | POA: Insufficient documentation

## 2020-01-01 DIAGNOSIS — R0602 Shortness of breath: Secondary | ICD-10-CM

## 2020-01-01 DIAGNOSIS — Z79899 Other long term (current) drug therapy: Secondary | ICD-10-CM | POA: Diagnosis not present

## 2020-01-01 NOTE — Addendum Note (Signed)
Addended by: Thora Lance on: 01/01/2020 04:26 PM   Modules accepted: Orders

## 2020-01-01 NOTE — Telephone Encounter (Signed)
Per Dr Caryl Comes pt in office for treadmill testing.  Dr Caryl Comes requesting and order be placed for a BMET and Mg today.  Orders placed as requested.

## 2020-01-02 LAB — BASIC METABOLIC PANEL
BUN/Creatinine Ratio: 14 (ref 9–20)
BUN: 15 mg/dL (ref 6–24)
CO2: 21 mmol/L (ref 20–29)
Calcium: 9.5 mg/dL (ref 8.7–10.2)
Chloride: 104 mmol/L (ref 96–106)
Creatinine, Ser: 1.04 mg/dL (ref 0.76–1.27)
GFR calc Af Amer: 95 mL/min/{1.73_m2} (ref >59)
GFR calc non Af Amer: 82 mL/min/{1.73_m2} (ref >59)
Glucose: 92 mg/dL (ref 65–99)
Potassium: 4.7 mmol/L (ref 3.5–5.2)
Sodium: 145 mmol/L — ABNORMAL HIGH (ref 134–144)

## 2020-01-02 LAB — MAGNESIUM: Magnesium: 1.8 mg/dL (ref 1.6–2.3)

## 2020-01-04 ENCOUNTER — Other Ambulatory Visit: Payer: Self-pay | Admitting: Internal Medicine

## 2020-01-07 DIAGNOSIS — M109 Gout, unspecified: Secondary | ICD-10-CM | POA: Diagnosis not present

## 2020-01-07 DIAGNOSIS — R7301 Impaired fasting glucose: Secondary | ICD-10-CM | POA: Diagnosis not present

## 2020-01-07 DIAGNOSIS — D696 Thrombocytopenia, unspecified: Secondary | ICD-10-CM | POA: Diagnosis not present

## 2020-01-07 DIAGNOSIS — I10 Essential (primary) hypertension: Secondary | ICD-10-CM | POA: Diagnosis not present

## 2020-01-07 DIAGNOSIS — Z0000 Encounter for general adult medical examination without abnormal findings: Secondary | ICD-10-CM | POA: Diagnosis not present

## 2020-01-07 DIAGNOSIS — R600 Localized edema: Secondary | ICD-10-CM | POA: Diagnosis not present

## 2020-01-07 DIAGNOSIS — Z125 Encounter for screening for malignant neoplasm of prostate: Secondary | ICD-10-CM | POA: Diagnosis not present

## 2020-01-07 DIAGNOSIS — E7849 Other hyperlipidemia: Secondary | ICD-10-CM | POA: Diagnosis not present

## 2020-01-08 ENCOUNTER — Other Ambulatory Visit (HOSPITAL_COMMUNITY): Payer: Self-pay | Admitting: Internal Medicine

## 2020-01-09 ENCOUNTER — Other Ambulatory Visit: Payer: Self-pay | Admitting: Internal Medicine

## 2020-01-30 DIAGNOSIS — R0683 Snoring: Secondary | ICD-10-CM | POA: Diagnosis not present

## 2020-01-30 DIAGNOSIS — I10 Essential (primary) hypertension: Secondary | ICD-10-CM | POA: Diagnosis not present

## 2020-01-30 DIAGNOSIS — G4733 Obstructive sleep apnea (adult) (pediatric): Secondary | ICD-10-CM | POA: Diagnosis not present

## 2020-02-20 ENCOUNTER — Other Ambulatory Visit (HOSPITAL_COMMUNITY): Payer: Self-pay | Admitting: Internal Medicine

## 2020-03-01 DIAGNOSIS — R0683 Snoring: Secondary | ICD-10-CM | POA: Diagnosis not present

## 2020-03-01 DIAGNOSIS — I10 Essential (primary) hypertension: Secondary | ICD-10-CM | POA: Diagnosis not present

## 2020-03-01 DIAGNOSIS — G4733 Obstructive sleep apnea (adult) (pediatric): Secondary | ICD-10-CM | POA: Diagnosis not present

## 2020-03-20 ENCOUNTER — Ambulatory Visit (INDEPENDENT_AMBULATORY_CARE_PROVIDER_SITE_OTHER): Payer: BC Managed Care – PPO | Admitting: *Deleted

## 2020-03-20 DIAGNOSIS — I428 Other cardiomyopathies: Secondary | ICD-10-CM

## 2020-03-20 LAB — CUP PACEART REMOTE DEVICE CHECK
Battery Remaining Longevity: 77 mo
Battery Voltage: 2.99 V
Brady Statistic AP VP Percent: 0.04 %
Brady Statistic AP VS Percent: 0.01 %
Brady Statistic AS VP Percent: 98.22 %
Brady Statistic AS VS Percent: 1.73 %
Brady Statistic RA Percent Paced: 0.05 %
Brady Statistic RV Percent Paced: 6.48 %
Date Time Interrogation Session: 20210624043824
HighPow Impedance: 66 Ohm
Implantable Lead Implant Date: 20190318
Implantable Lead Implant Date: 20190318
Implantable Lead Implant Date: 20190318
Implantable Lead Location: 753858
Implantable Lead Location: 753859
Implantable Lead Location: 753860
Implantable Lead Model: 293
Implantable Lead Model: 5076
Implantable Lead Serial Number: 442838
Implantable Pulse Generator Implant Date: 20190318
Lead Channel Impedance Value: 1007 Ohm
Lead Channel Impedance Value: 216.848
Lead Channel Impedance Value: 231.42 Ohm
Lead Channel Impedance Value: 240.906
Lead Channel Impedance Value: 266.667
Lead Channel Impedance Value: 289.049
Lead Channel Impedance Value: 342 Ohm
Lead Channel Impedance Value: 342 Ohm
Lead Channel Impedance Value: 399 Ohm
Lead Channel Impedance Value: 399 Ohm
Lead Channel Impedance Value: 475 Ohm
Lead Channel Impedance Value: 551 Ohm
Lead Channel Impedance Value: 608 Ohm
Lead Channel Impedance Value: 722 Ohm
Lead Channel Impedance Value: 874 Ohm
Lead Channel Impedance Value: 893 Ohm
Lead Channel Impedance Value: 931 Ohm
Lead Channel Impedance Value: 988 Ohm
Lead Channel Pacing Threshold Amplitude: 0.5 V
Lead Channel Pacing Threshold Amplitude: 0.875 V
Lead Channel Pacing Threshold Amplitude: 1.125 V
Lead Channel Pacing Threshold Pulse Width: 0.4 ms
Lead Channel Pacing Threshold Pulse Width: 0.4 ms
Lead Channel Pacing Threshold Pulse Width: 1 ms
Lead Channel Sensing Intrinsic Amplitude: 13.875 mV
Lead Channel Sensing Intrinsic Amplitude: 13.875 mV
Lead Channel Sensing Intrinsic Amplitude: 4.5 mV
Lead Channel Sensing Intrinsic Amplitude: 4.5 mV
Lead Channel Setting Pacing Amplitude: 0.5 V
Lead Channel Setting Pacing Amplitude: 2 V
Lead Channel Setting Pacing Amplitude: 2.25 V
Lead Channel Setting Pacing Pulse Width: 0.03 ms
Lead Channel Setting Pacing Pulse Width: 1 ms
Lead Channel Setting Sensing Sensitivity: 0.6 mV

## 2020-03-24 NOTE — Progress Notes (Signed)
Remote ICD transmission.   

## 2020-03-31 DIAGNOSIS — G4733 Obstructive sleep apnea (adult) (pediatric): Secondary | ICD-10-CM | POA: Diagnosis not present

## 2020-03-31 DIAGNOSIS — R0683 Snoring: Secondary | ICD-10-CM | POA: Diagnosis not present

## 2020-03-31 DIAGNOSIS — I10 Essential (primary) hypertension: Secondary | ICD-10-CM | POA: Diagnosis not present

## 2020-04-01 ENCOUNTER — Other Ambulatory Visit (HOSPITAL_COMMUNITY): Payer: Self-pay | Admitting: Internal Medicine

## 2020-04-09 DIAGNOSIS — M25571 Pain in right ankle and joints of right foot: Secondary | ICD-10-CM | POA: Diagnosis not present

## 2020-05-01 DIAGNOSIS — I10 Essential (primary) hypertension: Secondary | ICD-10-CM | POA: Diagnosis not present

## 2020-05-01 DIAGNOSIS — G4733 Obstructive sleep apnea (adult) (pediatric): Secondary | ICD-10-CM | POA: Diagnosis not present

## 2020-05-01 DIAGNOSIS — R0683 Snoring: Secondary | ICD-10-CM | POA: Diagnosis not present

## 2020-05-10 ENCOUNTER — Other Ambulatory Visit (HOSPITAL_COMMUNITY): Payer: Self-pay | Admitting: Internal Medicine

## 2020-05-13 ENCOUNTER — Other Ambulatory Visit (HOSPITAL_COMMUNITY): Payer: Self-pay | Admitting: Internal Medicine

## 2020-06-01 DIAGNOSIS — G4733 Obstructive sleep apnea (adult) (pediatric): Secondary | ICD-10-CM | POA: Diagnosis not present

## 2020-06-01 DIAGNOSIS — R0683 Snoring: Secondary | ICD-10-CM | POA: Diagnosis not present

## 2020-06-01 DIAGNOSIS — I10 Essential (primary) hypertension: Secondary | ICD-10-CM | POA: Diagnosis not present

## 2020-06-19 ENCOUNTER — Ambulatory Visit (INDEPENDENT_AMBULATORY_CARE_PROVIDER_SITE_OTHER): Payer: BC Managed Care – PPO | Admitting: Emergency Medicine

## 2020-06-19 DIAGNOSIS — I428 Other cardiomyopathies: Secondary | ICD-10-CM | POA: Diagnosis not present

## 2020-06-19 DIAGNOSIS — I447 Left bundle-branch block, unspecified: Secondary | ICD-10-CM

## 2020-06-20 LAB — CUP PACEART REMOTE DEVICE CHECK
Battery Remaining Longevity: 80 mo
Battery Voltage: 2.99 V
Brady Statistic AP VP Percent: 0.08 %
Brady Statistic AP VS Percent: 0.01 %
Brady Statistic AS VP Percent: 98.34 %
Brady Statistic AS VS Percent: 1.57 %
Brady Statistic RA Percent Paced: 0.09 %
Brady Statistic RV Percent Paced: 9.53 %
Date Time Interrogation Session: 20210923031807
HighPow Impedance: 64 Ohm
Implantable Lead Implant Date: 20190318
Implantable Lead Implant Date: 20190318
Implantable Lead Implant Date: 20190318
Implantable Lead Location: 753858
Implantable Lead Location: 753859
Implantable Lead Location: 753860
Implantable Lead Model: 293
Implantable Lead Model: 5076
Implantable Lead Serial Number: 442838
Implantable Pulse Generator Implant Date: 20190318
Lead Channel Impedance Value: 1026 Ohm
Lead Channel Impedance Value: 224.438
Lead Channel Impedance Value: 237.865
Lead Channel Impedance Value: 240.906
Lead Channel Impedance Value: 278.237
Lead Channel Impedance Value: 299.175
Lead Channel Impedance Value: 342 Ohm
Lead Channel Impedance Value: 342 Ohm
Lead Channel Impedance Value: 399 Ohm
Lead Channel Impedance Value: 399 Ohm
Lead Channel Impedance Value: 513 Ohm
Lead Channel Impedance Value: 589 Ohm
Lead Channel Impedance Value: 608 Ohm
Lead Channel Impedance Value: 722 Ohm
Lead Channel Impedance Value: 874 Ohm
Lead Channel Impedance Value: 893 Ohm
Lead Channel Impedance Value: 893 Ohm
Lead Channel Impedance Value: 988 Ohm
Lead Channel Pacing Threshold Amplitude: 0.5 V
Lead Channel Pacing Threshold Amplitude: 0.875 V
Lead Channel Pacing Threshold Amplitude: 0.875 V
Lead Channel Pacing Threshold Pulse Width: 0.4 ms
Lead Channel Pacing Threshold Pulse Width: 0.4 ms
Lead Channel Pacing Threshold Pulse Width: 1 ms
Lead Channel Sensing Intrinsic Amplitude: 12.25 mV
Lead Channel Sensing Intrinsic Amplitude: 12.25 mV
Lead Channel Sensing Intrinsic Amplitude: 4.125 mV
Lead Channel Sensing Intrinsic Amplitude: 4.125 mV
Lead Channel Setting Pacing Amplitude: 0.5 V
Lead Channel Setting Pacing Amplitude: 1.5 V
Lead Channel Setting Pacing Amplitude: 2 V
Lead Channel Setting Pacing Pulse Width: 0.03 ms
Lead Channel Setting Pacing Pulse Width: 1 ms
Lead Channel Setting Sensing Sensitivity: 0.6 mV

## 2020-06-24 NOTE — Progress Notes (Signed)
Remote ICD transmission.   

## 2020-07-15 DIAGNOSIS — X32XXXA Exposure to sunlight, initial encounter: Secondary | ICD-10-CM | POA: Diagnosis not present

## 2020-07-15 DIAGNOSIS — D2272 Melanocytic nevi of left lower limb, including hip: Secondary | ICD-10-CM | POA: Diagnosis not present

## 2020-07-15 DIAGNOSIS — L570 Actinic keratosis: Secondary | ICD-10-CM | POA: Diagnosis not present

## 2020-07-15 DIAGNOSIS — D2262 Melanocytic nevi of left upper limb, including shoulder: Secondary | ICD-10-CM | POA: Diagnosis not present

## 2020-07-15 DIAGNOSIS — D225 Melanocytic nevi of trunk: Secondary | ICD-10-CM | POA: Diagnosis not present

## 2020-07-15 DIAGNOSIS — D2261 Melanocytic nevi of right upper limb, including shoulder: Secondary | ICD-10-CM | POA: Diagnosis not present

## 2020-08-04 ENCOUNTER — Other Ambulatory Visit (HOSPITAL_COMMUNITY): Payer: Self-pay | Admitting: Internal Medicine

## 2020-08-07 ENCOUNTER — Other Ambulatory Visit (HOSPITAL_COMMUNITY): Payer: Self-pay | Admitting: Internal Medicine

## 2020-08-13 ENCOUNTER — Other Ambulatory Visit (HOSPITAL_COMMUNITY): Payer: Self-pay | Admitting: Internal Medicine

## 2020-08-24 ENCOUNTER — Other Ambulatory Visit (HOSPITAL_COMMUNITY): Payer: Self-pay | Admitting: Internal Medicine

## 2020-08-25 ENCOUNTER — Other Ambulatory Visit (HOSPITAL_COMMUNITY): Payer: Self-pay | Admitting: *Deleted

## 2020-08-25 MED ORDER — LOSARTAN POTASSIUM 100 MG PO TABS: 100.0000 mg | ORAL_TABLET | Freq: Every day | ORAL | 0 refills | Status: DC

## 2020-09-12 DIAGNOSIS — M25551 Pain in right hip: Secondary | ICD-10-CM | POA: Diagnosis not present

## 2020-09-12 DIAGNOSIS — M545 Low back pain, unspecified: Secondary | ICD-10-CM | POA: Diagnosis not present

## 2020-09-18 ENCOUNTER — Other Ambulatory Visit (HOSPITAL_COMMUNITY): Payer: Self-pay | Admitting: Internal Medicine

## 2020-10-10 DIAGNOSIS — M545 Low back pain, unspecified: Secondary | ICD-10-CM | POA: Diagnosis not present

## 2020-10-17 ENCOUNTER — Other Ambulatory Visit (HOSPITAL_COMMUNITY): Payer: Self-pay | Admitting: Internal Medicine

## 2020-10-17 DIAGNOSIS — M5459 Other low back pain: Secondary | ICD-10-CM | POA: Diagnosis not present

## 2020-10-17 DIAGNOSIS — M5441 Lumbago with sciatica, right side: Secondary | ICD-10-CM | POA: Diagnosis not present

## 2020-10-24 DIAGNOSIS — M5441 Lumbago with sciatica, right side: Secondary | ICD-10-CM | POA: Diagnosis not present

## 2020-10-24 DIAGNOSIS — M5459 Other low back pain: Secondary | ICD-10-CM | POA: Diagnosis not present

## 2020-10-31 ENCOUNTER — Ambulatory Visit (HOSPITAL_COMMUNITY)
Admission: RE | Admit: 2020-10-31 | Discharge: 2020-10-31 | Disposition: A | Discharge status: Home / Self Care | Payer: BC Managed Care – PPO | Source: Ambulatory Visit | Attending: Internal Medicine | Admitting: Internal Medicine

## 2020-10-31 ENCOUNTER — Other Ambulatory Visit: Payer: Self-pay

## 2020-10-31 ENCOUNTER — Encounter (HOSPITAL_COMMUNITY): Payer: Self-pay | Admitting: Internal Medicine

## 2020-10-31 VITALS — BP 140/82 | HR 82 | Wt 223.4 lb

## 2020-10-31 DIAGNOSIS — I110 Hypertensive heart disease with heart failure: Secondary | ICD-10-CM | POA: Insufficient documentation

## 2020-10-31 DIAGNOSIS — M109 Gout, unspecified: Secondary | ICD-10-CM | POA: Diagnosis not present

## 2020-10-31 DIAGNOSIS — Z95810 Presence of automatic (implantable) cardiac defibrillator: Secondary | ICD-10-CM | POA: Diagnosis not present

## 2020-10-31 DIAGNOSIS — I428 Other cardiomyopathies: Secondary | ICD-10-CM | POA: Diagnosis not present

## 2020-10-31 DIAGNOSIS — I447 Left bundle-branch block, unspecified: Secondary | ICD-10-CM | POA: Diagnosis not present

## 2020-10-31 DIAGNOSIS — I5022 Chronic systolic (congestive) heart failure: Secondary | ICD-10-CM

## 2020-10-31 DIAGNOSIS — E785 Hyperlipidemia, unspecified: Secondary | ICD-10-CM | POA: Insufficient documentation

## 2020-10-31 DIAGNOSIS — Z79899 Other long term (current) drug therapy: Secondary | ICD-10-CM | POA: Insufficient documentation

## 2020-10-31 NOTE — Patient Instructions (Signed)
Congratulations! You don't have to follow up unless you need to!  If you have any questions or concerns before your next appointment please send [place] a message through [place] or call our office at [phone].    TO LEAVE A MESSAGE FOR THE NURSE SELECT OPTION 2, PLEASE LEAVE A MESSAGE INCLUDING: . YOUR NAME . DATE OF BIRTH . CALL BACK NUMBER . REASON FOR CALL**this is important as we prioritize the call backs  YOU WILL RECEIVE A CALL BACK THE SAME DAY AS LONG AS YOU CALL BEFORE 4:00 PM

## 2020-10-31 NOTE — Progress Notes (Signed)
Advanced Heart Failure Clinic Consult Note   Referring Physician: Dr. Brigitte Pulse Primary Care: Dr. Brigitte Pulse Primary Cardiologist: New to Dr. Haroldine Laws   HPI: Joseph Parrish is a 55 year old male with a past medical history of HTN, gout. Newly diagnosed with systolic CHF in June 3151 with EF 25%. He is referred by Dr. Brigitte Pulse for further management of HF.    He was seen in the ED in 10/2016 with abdominal pain, found to have acute cholecystitis. S/p lap chole.   He was seen in Dr. Raul Del office for regular follow up and noted to have a LBBB on EKG. He was then referred for an Echo. EF was reduced at 25% and referred to the HF Clinic for evaluation.  Underwent cath in 7/18 which showed normal coronary arteries. EF 40% We ordered cMRI to look for infiltrative or orther processes but this was denied. Also had sleep study with AHI ~ 5.   - cMRI 11/18 EF 26% no LGE  Underwent [employer] CRT-D implant on 12/12/17 with Dr. Bosie Helper.   Echo 3/20: EF 50-55% Echo 4/21: EF 55-66%  Returns today for HF follow up. Doing well. No SOB, CP. Still with mild ankle swelling.   ICD interrogation today: No AF/VT. 100% vpacing. . Activity level 3-4hours per day. In early December had period of fluid overload but now resolved Personally reviewed   LHC 7/18   Normal coronary arteries EF ~40% by V-gram   Past Medical History:  Diagnosis Date  . Allergy   . CHF (congestive heart failure) (HCC)    cardiomyopathy  . Cholecystitis   . Cholelithiasis   . Diverticulosis   . GERD (gastroesophageal reflux disease)    diet   . Gout   . Headache    migraines  . History of kidney stones feb 2018 passed   . Hypertension   . Sleep apnea    new dx sept 2018 looking at dental appliances for mild osa    Current Outpatient Medications  Medication Sig Dispense Refill  . acetaminophen (TYLENOL) 500 MG tablet Take 500-1,000 mg by mouth every 6 (six) hours as needed for moderate pain or headache.     . allopurinol  (ZYLOPRIM) 100 MG tablet Take 100 mg by mouth daily.    . carvedilol (COREG) 6.25 MG tablet TAKE 1 TABLET (6.25 MG TOTAL) BY MOUTH 2 TIMES DAILY WITH A MEAL. MUST BE SEEN FOR FURTHER REFILLS 180 tablet 0  . colchicine 0.6 MG tablet Take 0.6 mg daily as needed by mouth (for gout flares).    . furosemide (LASIX) 20 MG tablet Take 40 mg by mouth daily.    Marland Kitchen ibuprofen (ADVIL,MOTRIN) 200 MG tablet Take 600 mg by mouth every 8 (eight) hours as needed for headache or moderate pain.     Marland Kitchen loratadine (CLARITIN) 10 MG tablet Take 10 mg by mouth at bedtime.     Marland Kitchen losartan (COZAAR) 100 MG tablet TAKE 1 TABLET (100 MG TOTAL) BY MOUTH DAILY. NEEDS APPT FOR FURTHER REFILLS 30 tablet 0   No current facility-administered medications for this encounter.    No Known Allergies    Social History   Socioeconomic History  . Marital status: Single    Spouse name: Not on file  . Number of children: Not on file  . Years of education: Not on file  . Highest education level: Not on file  Occupational History  . Not on file  Tobacco Use  . Smoking status: Never Smoker  .  Smokeless tobacco: Never Used  Vaping Use  . Vaping Use: Never used  Substance and Sexual Activity  . Alcohol use: Yes    Alcohol/week: 0.0 standard drinks    Comment: socially  . Drug use: No  . Sexual activity: Not on file  Other Topics Concern  . Not on file  Social History Narrative  . Not on file   Social Determinants of Health   Financial Resource Strain: Not on file  Food Insecurity: Not on file  Transportation Needs: Not on file  Physical Activity: Not on file  Stress: Not on file  Social Connections: Not on file  Intimate Partner Violence: Not on file      Family History  Problem Relation Age of Onset  . Aneurysm Father 70  . Stroke Paternal Grandfather   . Colon cancer Neg Hx   . Colon polyps Neg Hx   . Esophageal cancer Neg Hx   . Rectal cancer Neg Hx   . Stomach cancer Neg Hx     Vitals:   10/31/20 0911   BP: 140/82  Pulse: 82  SpO2: 95%  Weight: 101.3 kg (223 lb 6.4 oz)     PHYSICAL EXAM: General:  Well appearing. No resp difficulty HEENT: normal Neck: supple. no JVD. Carotids 2+ bilat; no bruits. No lymphadenopathy or thryomegaly appreciated. Cor: PMI nondisplaced. Regular rate & rhythm. No rubs, gallops or murmurs. Lungs: clear Abdomen: soft, nontender, nondistended. No hepatosplenomegaly. No bruits or masses. Good bowel sounds. Extremities: no cyanosis, clubbing, rash, edema Neuro: alert & orientedx3, cranial nerves grossly intact. moves all 4 extremities w/o difficulty. Affect pleasant   ASSESSMENT & PLAN: 1. Chronic systolic CHF due to LBBB with recovered EF:  - NICM with EF 35-40% on previous echo and cath. - cMRI 11/18 EF 26% no LGE - s/p MDT CRT-D placement 3/19 - Echo 02/2018: EF 45-50%, grade 2 DD - Echo 3/20 EF 50-55% (improved with CRT) - Echo 4/21 EF 55-60% - Overall doing well NYHA class I - ICD interrogation as above - Continue losartan to 100mg  daily.  - Continue carvedilol 12.5mg  bid - Have held off on spiro with last K 4.8 - LV dysfunction resolved with CRT. Continue current meds. Can graduate from [facility]. Follow with Dr. Caryl Comes   2. LBBB - Likely the cause of his cardiomyopathy.  - s/p MDT CRT-D 3/19 - Follows with Dr Caryl Comes.  3. HLD - Follows with Dr. Brigitte Pulse.    Glori Bickers, MD 10/31/20

## 2020-11-11 ENCOUNTER — Other Ambulatory Visit (HOSPITAL_COMMUNITY): Payer: Self-pay | Admitting: Internal Medicine

## 2020-12-15 ENCOUNTER — Other Ambulatory Visit: Payer: Self-pay

## 2020-12-15 ENCOUNTER — Ambulatory Visit: Payer: BC Managed Care – PPO | Admitting: Internal Medicine

## 2020-12-15 ENCOUNTER — Encounter: Payer: Self-pay | Admitting: Internal Medicine

## 2020-12-15 VITALS — BP 100/70 | HR 59 | Ht 68.0 in | Wt 217.0 lb

## 2020-12-15 DIAGNOSIS — I428 Other cardiomyopathies: Secondary | ICD-10-CM | POA: Diagnosis not present

## 2020-12-15 DIAGNOSIS — I5022 Chronic systolic (congestive) heart failure: Secondary | ICD-10-CM

## 2020-12-15 DIAGNOSIS — I447 Left bundle-branch block, unspecified: Secondary | ICD-10-CM

## 2020-12-15 DIAGNOSIS — Z95810 Presence of automatic (implantable) cardiac defibrillator: Secondary | ICD-10-CM

## 2020-12-15 NOTE — Progress Notes (Signed)
Patient Care Team: Ginger Organ., MD as PCP - General (Internal Medicine) Bensimhon, Shaune Pascal, MD as PCP - Cardiology (Cardiology)   HPI  Joseph Parrish is a 55 y.o. male Seen in follow-up for CRT-D for nonischemic cardiomyopathy and left bundle branch block.  There has been interval near normalization of LV function.  Functional status is been quite good although he has been much less active.  Weight is up.  At last visit there were issues related to varying QRS morphologies the explanation of each of which I was never able to fully understand  Is currently programmed some subthreshold adaptive CRT so that he has essentially only LV pacing  Lightheadedness the other day with bending over.  No chest pain.  No edema orthopnea or nocturnal dyspnea.  A little bit of dyspnea with exertion  DATE TEST EF   6/18 Echo   25 %   7/18 Cath    40 % Normal CA  11/18 cMRI  26 % LGE neg  6/19 Echo  45-50%   4/21 Echo  55-60%     Date Cr K Hgb  3/19 1.39 4.8 15.8  4/21  1.04 4.7      Past Medical History:  Diagnosis Date  . Allergy   . CHF (congestive heart failure) (HCC)    cardiomyopathy  . Cholecystitis   . Cholelithiasis   . Diverticulosis   . GERD (gastroesophageal reflux disease)    diet   . Gout   . Headache    migraines  . History of kidney stones feb 2018 passed   . Hypertension   . Sleep apnea    new dx sept 2018 looking at dental appliances for mild osa    Past Surgical History:  Procedure Laterality Date  . BIV ICD INSERTION CRT-D N/A 12/12/2017   Procedure: BIV ICD INSERTION CRT-D;  Surgeon: Deboraha Sprang, MD;  Location: [facility];  Service: Cardiovascular;  Laterality: N/A;  . CHOLECYSTECTOMY  01/2017  . COLONOSCOPY WITH PROPOFOL N/A 07/26/2017   Procedure: COLONOSCOPY WITH PROPOFOL;  Surgeon: Jerene Bears, MD;  Location: [facility];  Service: Gastroenterology;  Laterality: N/A;  . LEFT HEART CATH AND CORONARY ANGIOGRAPHY N/A  04/08/2017   Procedure: Left Heart Cath and Coronary Angiography;  Surgeon: Jolaine Artist, MD;  Location: [facility];  Service: Cardiovascular;  Laterality: N/A;  . TONSILLECTOMY AND ADENOIDECTOMY      Current Meds  Medication Sig  . acetaminophen (TYLENOL) 500 MG tablet Take 500-1,000 mg by mouth every 6 (six) hours as needed for moderate pain or headache.   . allopurinol (ZYLOPRIM) 100 MG tablet Take 100 mg by mouth daily.  . carvedilol (COREG) 6.25 MG tablet TAKE 1 TABLET (6.25 MG TOTAL) BY MOUTH 2 TIMES DAILY WITH A MEAL. MUST BE SEEN FOR FURTHER REFILLS  . colchicine 0.6 MG tablet Take 0.6 mg daily as needed by mouth (for gout flares).  . furosemide (LASIX) 20 MG tablet Take 40 mg by mouth daily.  Marland Kitchen ibuprofen (ADVIL,MOTRIN) 200 MG tablet Take 600 mg by mouth every 8 (eight) hours as needed for headache or moderate pain.   Marland Kitchen loratadine (CLARITIN) 10 MG tablet Take 10 mg by mouth at bedtime.   Marland Kitchen losartan (COZAAR) 100 MG tablet Take 1 tablet (100 mg total) by mouth daily.  Marland Kitchen omeprazole (PRILOSEC) 40 MG capsule Take 40 mg by mouth daily.    No Known Allergies  Review of Systems negative except from HPI and PMH  Physical Exam BP 100/70 (BP Location: Left Arm, Patient Position: Sitting, Cuff Size: Normal)   Pulse (!) 59   Ht 5\' 8"  (1.727 m)   Wt 217 lb (98.4 kg)   SpO2 98%   BMI 32.99 kg/m  Well developed and well nourished in no acute distress HENT normal Neck supple with JVP-flat Clear Device pocket well healed; without hematoma or erythema.  There is no tethering  Regular rate and rhythm, no  murmur Abd-soft with active BS No Clubbing cyanosis   edema Skin-warm and dry A & Oriented  Grossly normal sensory and motor function  ECG P-synchronous/ AV  pacing  17/14/42 rS lead V1 and QR lead 1   Unchanged from 3/20   4/19 neg QRS lead 1 and upright V1 Assessment and  Plan  LBBB  NICM  PVCs 10 % >= ( does not include run)-  CRT -Medtronic QRS is  much shorter with aCRT than Biv   Sinus node dysfunction  CHF chronic systolic/diastolic    Device function is normal.  PVC burden seems to be about less than 1%.  And higher heart rates ventricular sensing occurs.  And as such we will increase his tracking rate from 130--140  Continue current heart failure meds.  We will plan to reassess LV function in a year.  Encouraged weight loss.  Up to 30 pounds or so in the last 3-1/2 years.

## 2020-12-15 NOTE — Patient Instructions (Signed)
Medication Instructions:  Your physician recommends that you continue on your current medications as directed. Please refer to the Current Medication list given to you today.  *If you need a refill on your cardiac medications before your next appointment, please call your pharmacy*   Lab Work: None ordered.  If you have labs (blood work) drawn today and your tests are completely normal, you will receive your results only by: Marland Kitchen MyChart Message (if you have MyChart) OR . A paper copy in the mail If you have any lab test that is abnormal or we need to change your treatment, we will call you to review the results.   Testing/Procedures:  Echo in 12 months Your physician has requested that you have an echocardiogram. Echocardiography is a painless test that uses sound waves to create images of your heart. It provides your doctor with information about the size and shape of your heart and how well your heart's chambers and valves are working. This procedure takes approximately one hour. There are no restrictions for this procedure.     Follow-Up: At [facility], you and your health needs are our priority.  As part of our continuing mission to provide you with exceptional heart care, we have created designated Provider Care Teams.  These Care Teams include your primary Cardiologist (physician) and Advanced Practice Providers (APPs -  Physician Assistants and Nurse Practitioners) who all work together to provide you with the care you need, when you need it.  We recommend signing up for the patient portal called "MyChart".  Sign up information is provided on this After Visit Summary.  MyChart is used to connect with patients for Virtual Visits (Telemedicine).  Patients are able to view lab/test results, encounter notes, upcoming appointments, etc.  Non-urgent messages can be sent to your provider as well.   To learn more about what you can do with MyChart, go to [email or web address].    Your  next appointment:   6 month(s)  The format for your next appointment:   In Person  Provider:  With Dr Olin Pia PA    Other Instructions  You are to see Dr Caryl Comes in 12 months with an echo prior to that visit.

## 2020-12-18 ENCOUNTER — Ambulatory Visit (INDEPENDENT_AMBULATORY_CARE_PROVIDER_SITE_OTHER): Payer: BC Managed Care – PPO

## 2020-12-18 DIAGNOSIS — I428 Other cardiomyopathies: Secondary | ICD-10-CM | POA: Diagnosis not present

## 2020-12-18 LAB — CUP PACEART REMOTE DEVICE CHECK
Battery Remaining Longevity: 71 mo
Battery Voltage: 2.97 V
Brady Statistic AP VP Percent: 0.04 %
Brady Statistic AP VS Percent: 0.01 %
Brady Statistic AS VP Percent: 98.14 %
Brady Statistic AS VS Percent: 1.82 %
Brady Statistic RA Percent Paced: 0.05 %
Brady Statistic RV Percent Paced: 6.82 %
Date Time Interrogation Session: 20220324012204
HighPow Impedance: 80 Ohm
Implantable Lead Implant Date: 20190318
Implantable Lead Implant Date: 20190318
Implantable Lead Implant Date: 20190318
Implantable Lead Location: 753858
Implantable Lead Location: 753859
Implantable Lead Location: 753860
Implantable Lead Model: 293
Implantable Lead Model: 5076
Implantable Lead Serial Number: 442838
Implantable Pulse Generator Implant Date: 20190318
Lead Channel Impedance Value: 1026 Ohm
Lead Channel Impedance Value: 1064 Ohm
Lead Channel Impedance Value: 234.08 Ohm
Lead Channel Impedance Value: 244.491
Lead Channel Impedance Value: 253.786
Lead Channel Impedance Value: 291.742
Lead Channel Impedance Value: 308.092
Lead Channel Impedance Value: 361 Ohm
Lead Channel Impedance Value: 361 Ohm
Lead Channel Impedance Value: 399 Ohm
Lead Channel Impedance Value: 418 Ohm
Lead Channel Impedance Value: 532 Ohm
Lead Channel Impedance Value: 589 Ohm
Lead Channel Impedance Value: 646 Ohm
Lead Channel Impedance Value: 779 Ohm
Lead Channel Impedance Value: 893 Ohm
Lead Channel Impedance Value: 931 Ohm
Lead Channel Impedance Value: 950 Ohm
Lead Channel Pacing Threshold Amplitude: 0.5 V
Lead Channel Pacing Threshold Amplitude: 0.875 V
Lead Channel Pacing Threshold Amplitude: 0.875 V
Lead Channel Pacing Threshold Pulse Width: 0.4 ms
Lead Channel Pacing Threshold Pulse Width: 0.4 ms
Lead Channel Pacing Threshold Pulse Width: 1 ms
Lead Channel Sensing Intrinsic Amplitude: 13.5 mV
Lead Channel Sensing Intrinsic Amplitude: 13.5 mV
Lead Channel Sensing Intrinsic Amplitude: 4.25 mV
Lead Channel Sensing Intrinsic Amplitude: 4.25 mV
Lead Channel Setting Pacing Amplitude: 0.5 V
Lead Channel Setting Pacing Amplitude: 1.5 V
Lead Channel Setting Pacing Amplitude: 2 V
Lead Channel Setting Pacing Pulse Width: 0.03 ms
Lead Channel Setting Pacing Pulse Width: 1 ms
Lead Channel Setting Sensing Sensitivity: 0.6 mV

## 2020-12-30 NOTE — Progress Notes (Signed)
Remote ICD transmission.   

## 2021-01-14 DIAGNOSIS — M109 Gout, unspecified: Secondary | ICD-10-CM | POA: Diagnosis not present

## 2021-01-14 DIAGNOSIS — R7301 Impaired fasting glucose: Secondary | ICD-10-CM | POA: Diagnosis not present

## 2021-01-14 DIAGNOSIS — Z125 Encounter for screening for malignant neoplasm of prostate: Secondary | ICD-10-CM | POA: Diagnosis not present

## 2021-01-14 DIAGNOSIS — E785 Hyperlipidemia, unspecified: Secondary | ICD-10-CM | POA: Diagnosis not present

## 2021-01-21 DIAGNOSIS — Z1331 Encounter for screening for depression: Secondary | ICD-10-CM | POA: Diagnosis not present

## 2021-01-21 DIAGNOSIS — R82998 Other abnormal findings in urine: Secondary | ICD-10-CM | POA: Diagnosis not present

## 2021-01-21 DIAGNOSIS — I10 Essential (primary) hypertension: Secondary | ICD-10-CM | POA: Diagnosis not present

## 2021-01-21 DIAGNOSIS — Z1339 Encounter for screening examination for other mental health and behavioral disorders: Secondary | ICD-10-CM | POA: Diagnosis not present

## 2021-01-21 DIAGNOSIS — Z0000 Encounter for general adult medical examination without abnormal findings: Secondary | ICD-10-CM | POA: Diagnosis not present

## 2021-03-19 ENCOUNTER — Ambulatory Visit (INDEPENDENT_AMBULATORY_CARE_PROVIDER_SITE_OTHER): Payer: BC Managed Care – PPO

## 2021-03-19 DIAGNOSIS — I428 Other cardiomyopathies: Secondary | ICD-10-CM

## 2021-03-20 LAB — CUP PACEART REMOTE DEVICE CHECK
Battery Remaining Longevity: 66 mo
Battery Voltage: 2.98 V
Brady Statistic AP VP Percent: 0.04 %
Brady Statistic AP VS Percent: 0.01 %
Brady Statistic AS VP Percent: 98.52 %
Brady Statistic AS VS Percent: 1.43 %
Brady Statistic RA Percent Paced: 0.05 %
Brady Statistic RV Percent Paced: 7.77 %
Date Time Interrogation Session: 20220623012405
HighPow Impedance: 80 Ohm
Implantable Lead Implant Date: 20190318
Implantable Lead Implant Date: 20190318
Implantable Lead Implant Date: 20190318
Implantable Lead Location: 753858
Implantable Lead Location: 753859
Implantable Lead Location: 753860
Implantable Lead Model: 293
Implantable Lead Model: 5076
Implantable Lead Serial Number: 442838
Implantable Pulse Generator Implant Date: 20190318
Lead Channel Impedance Value: 1064 Ohm
Lead Channel Impedance Value: 241.412
Lead Channel Impedance Value: 260.571
Lead Channel Impedance Value: 270.508
Lead Channel Impedance Value: 289.597
Lead Channel Impedance Value: 317.612
Lead Channel Impedance Value: 361 Ohm
Lead Channel Impedance Value: 399 Ohm
Lead Channel Impedance Value: 418 Ohm
Lead Channel Impedance Value: 456 Ohm
Lead Channel Impedance Value: 513 Ohm
Lead Channel Impedance Value: 608 Ohm
Lead Channel Impedance Value: 665 Ohm
Lead Channel Impedance Value: 817 Ohm
Lead Channel Impedance Value: 950 Ohm
Lead Channel Impedance Value: 950 Ohm
Lead Channel Impedance Value: 988 Ohm
Lead Channel Impedance Value: 988 Ohm
Lead Channel Pacing Threshold Amplitude: 0.5 V
Lead Channel Pacing Threshold Amplitude: 0.875 V
Lead Channel Pacing Threshold Amplitude: 0.875 V
Lead Channel Pacing Threshold Pulse Width: 0.4 ms
Lead Channel Pacing Threshold Pulse Width: 0.4 ms
Lead Channel Pacing Threshold Pulse Width: 1 ms
Lead Channel Sensing Intrinsic Amplitude: 13.875 mV
Lead Channel Sensing Intrinsic Amplitude: 13.875 mV
Lead Channel Sensing Intrinsic Amplitude: 4.875 mV
Lead Channel Sensing Intrinsic Amplitude: 4.875 mV
Lead Channel Setting Pacing Amplitude: 0.5 V
Lead Channel Setting Pacing Amplitude: 1.5 V
Lead Channel Setting Pacing Amplitude: 2 V
Lead Channel Setting Pacing Pulse Width: 0.03 ms
Lead Channel Setting Pacing Pulse Width: 1 ms
Lead Channel Setting Sensing Sensitivity: 0.6 mV

## 2021-04-08 NOTE — Progress Notes (Signed)
Remote ICD transmission.   

## 2021-06-18 ENCOUNTER — Ambulatory Visit (INDEPENDENT_AMBULATORY_CARE_PROVIDER_SITE_OTHER): Payer: BC Managed Care – PPO

## 2021-06-18 DIAGNOSIS — I428 Other cardiomyopathies: Secondary | ICD-10-CM

## 2021-06-18 DIAGNOSIS — I5022 Chronic systolic (congestive) heart failure: Secondary | ICD-10-CM

## 2021-06-18 LAB — CUP PACEART REMOTE DEVICE CHECK
Battery Remaining Longevity: 61 mo
Battery Voltage: 2.98 V
Brady Statistic AP VP Percent: 0.04 %
Brady Statistic AP VS Percent: 0.01 %
Brady Statistic AS VP Percent: 98.55 %
Brady Statistic AS VS Percent: 1.4 %
Brady Statistic RA Percent Paced: 0.05 %
Brady Statistic RV Percent Paced: 7.58 %
Date Time Interrogation Session: 20220922012304
HighPow Impedance: 74 Ohm
Implantable Lead Implant Date: 20190318
Implantable Lead Implant Date: 20190318
Implantable Lead Implant Date: 20190318
Implantable Lead Location: 753858
Implantable Lead Location: 753859
Implantable Lead Location: 753860
Implantable Lead Model: 293
Implantable Lead Model: 5076
Implantable Lead Serial Number: 442838
Implantable Pulse Generator Implant Date: 20190318
Lead Channel Impedance Value: 1007 Ohm
Lead Channel Impedance Value: 1007 Ohm
Lead Channel Impedance Value: 230.327
Lead Channel Impedance Value: 237.686
Lead Channel Impedance Value: 253.786
Lead Channel Impedance Value: 285.934
Lead Channel Impedance Value: 297.365
Lead Channel Impedance Value: 342 Ohm
Lead Channel Impedance Value: 361 Ohm
Lead Channel Impedance Value: 399 Ohm
Lead Channel Impedance Value: 418 Ohm
Lead Channel Impedance Value: 513 Ohm
Lead Channel Impedance Value: 551 Ohm
Lead Channel Impedance Value: 646 Ohm
Lead Channel Impedance Value: 760 Ohm
Lead Channel Impedance Value: 836 Ohm
Lead Channel Impedance Value: 893 Ohm
Lead Channel Impedance Value: 931 Ohm
Lead Channel Pacing Threshold Amplitude: 0.5 V
Lead Channel Pacing Threshold Amplitude: 0.75 V
Lead Channel Pacing Threshold Amplitude: 0.875 V
Lead Channel Pacing Threshold Pulse Width: 0.4 ms
Lead Channel Pacing Threshold Pulse Width: 0.4 ms
Lead Channel Pacing Threshold Pulse Width: 1 ms
Lead Channel Sensing Intrinsic Amplitude: 14 mV
Lead Channel Sensing Intrinsic Amplitude: 14 mV
Lead Channel Sensing Intrinsic Amplitude: 4.625 mV
Lead Channel Sensing Intrinsic Amplitude: 4.625 mV
Lead Channel Setting Pacing Amplitude: 0.5 V
Lead Channel Setting Pacing Amplitude: 1.25 V
Lead Channel Setting Pacing Amplitude: 2 V
Lead Channel Setting Pacing Pulse Width: 0.03 ms
Lead Channel Setting Pacing Pulse Width: 1 ms
Lead Channel Setting Sensing Sensitivity: 0.6 mV

## 2021-06-25 NOTE — Progress Notes (Signed)
Remote ICD transmission.   

## 2021-07-15 DIAGNOSIS — L570 Actinic keratosis: Secondary | ICD-10-CM | POA: Diagnosis not present

## 2021-07-15 DIAGNOSIS — D2262 Melanocytic nevi of left upper limb, including shoulder: Secondary | ICD-10-CM | POA: Diagnosis not present

## 2021-07-15 DIAGNOSIS — D485 Neoplasm of uncertain behavior of skin: Secondary | ICD-10-CM | POA: Diagnosis not present

## 2021-07-15 DIAGNOSIS — L309 Dermatitis, unspecified: Secondary | ICD-10-CM | POA: Diagnosis not present

## 2021-07-15 DIAGNOSIS — D2272 Melanocytic nevi of left lower limb, including hip: Secondary | ICD-10-CM | POA: Diagnosis not present

## 2021-07-15 DIAGNOSIS — D2261 Melanocytic nevi of right upper limb, including shoulder: Secondary | ICD-10-CM | POA: Diagnosis not present

## 2021-07-15 DIAGNOSIS — X32XXXA Exposure to sunlight, initial encounter: Secondary | ICD-10-CM | POA: Diagnosis not present

## 2021-07-15 DIAGNOSIS — D225 Melanocytic nevi of trunk: Secondary | ICD-10-CM | POA: Diagnosis not present

## 2021-08-06 DIAGNOSIS — R058 Other specified cough: Secondary | ICD-10-CM | POA: Diagnosis not present

## 2021-08-06 DIAGNOSIS — J069 Acute upper respiratory infection, unspecified: Secondary | ICD-10-CM | POA: Diagnosis not present

## 2021-09-17 ENCOUNTER — Ambulatory Visit (INDEPENDENT_AMBULATORY_CARE_PROVIDER_SITE_OTHER): Payer: BC Managed Care – PPO

## 2021-09-17 DIAGNOSIS — I5022 Chronic systolic (congestive) heart failure: Secondary | ICD-10-CM

## 2021-09-17 DIAGNOSIS — I428 Other cardiomyopathies: Secondary | ICD-10-CM | POA: Diagnosis not present

## 2021-09-17 LAB — CUP PACEART REMOTE DEVICE CHECK
Battery Remaining Longevity: 58 mo
Battery Voltage: 2.97 V
Brady Statistic AP VP Percent: 0.04 %
Brady Statistic AP VS Percent: 0.01 %
Brady Statistic AS VP Percent: 98.5 %
Brady Statistic AS VS Percent: 1.45 %
Brady Statistic RA Percent Paced: 0.05 %
Brady Statistic RV Percent Paced: 7.36 %
Date Time Interrogation Session: 20221222001804
HighPow Impedance: 82 Ohm
Implantable Lead Implant Date: 20190318
Implantable Lead Implant Date: 20190318
Implantable Lead Implant Date: 20190318
Implantable Lead Location: 753858
Implantable Lead Location: 753859
Implantable Lead Location: 753860
Implantable Lead Model: 293
Implantable Lead Model: 5076
Implantable Lead Serial Number: 442838
Implantable Pulse Generator Implant Date: 20190318
Lead Channel Impedance Value: 1007 Ohm
Lead Channel Impedance Value: 1064 Ohm
Lead Channel Impedance Value: 245.538
Lead Channel Impedance Value: 260.571
Lead Channel Impedance Value: 260.571
Lead Channel Impedance Value: 283.733
Lead Channel Impedance Value: 304 Ohm
Lead Channel Impedance Value: 361 Ohm
Lead Channel Impedance Value: 361 Ohm
Lead Channel Impedance Value: 418 Ohm
Lead Channel Impedance Value: 456 Ohm
Lead Channel Impedance Value: 532 Ohm
Lead Channel Impedance Value: 608 Ohm
Lead Channel Impedance Value: 608 Ohm
Lead Channel Impedance Value: 817 Ohm
Lead Channel Impedance Value: 931 Ohm
Lead Channel Impedance Value: 950 Ohm
Lead Channel Impedance Value: 988 Ohm
Lead Channel Pacing Threshold Amplitude: 0.5 V
Lead Channel Pacing Threshold Amplitude: 0.625 V
Lead Channel Pacing Threshold Amplitude: 0.875 V
Lead Channel Pacing Threshold Pulse Width: 0.4 ms
Lead Channel Pacing Threshold Pulse Width: 0.4 ms
Lead Channel Pacing Threshold Pulse Width: 1 ms
Lead Channel Sensing Intrinsic Amplitude: 14.125 mV
Lead Channel Sensing Intrinsic Amplitude: 14.125 mV
Lead Channel Sensing Intrinsic Amplitude: 4.25 mV
Lead Channel Sensing Intrinsic Amplitude: 4.25 mV
Lead Channel Setting Pacing Amplitude: 0.5 V
Lead Channel Setting Pacing Amplitude: 1.25 V
Lead Channel Setting Pacing Amplitude: 2 V
Lead Channel Setting Pacing Pulse Width: 0.03 ms
Lead Channel Setting Pacing Pulse Width: 1 ms
Lead Channel Setting Sensing Sensitivity: 0.6 mV

## 2021-09-29 NOTE — Progress Notes (Signed)
Remote ICD transmission.   

## 2021-10-07 ENCOUNTER — Encounter (HOSPITAL_COMMUNITY): Payer: Self-pay

## 2021-12-10 ENCOUNTER — Telehealth: Payer: Self-pay | Admitting: Internal Medicine

## 2021-12-10 ENCOUNTER — Ambulatory Visit (HOSPITAL_COMMUNITY): Payer: Self-pay | Attending: Cardiology

## 2021-12-10 ENCOUNTER — Encounter (HOSPITAL_COMMUNITY): Payer: Self-pay | Admitting: Internal Medicine

## 2021-12-10 ENCOUNTER — Encounter (HOSPITAL_COMMUNITY): Payer: Self-pay

## 2021-12-10 NOTE — Telephone Encounter (Signed)
Can his echo order please be extended so I can schedule him for 3/23.  Thanks. ?

## 2021-12-10 NOTE — Progress Notes (Signed)
Verified appointment "no show" status with G. Andrews at 08:41.  ?

## 2021-12-17 ENCOUNTER — Ambulatory Visit (INDEPENDENT_AMBULATORY_CARE_PROVIDER_SITE_OTHER): Payer: BC Managed Care – PPO

## 2021-12-17 ENCOUNTER — Other Ambulatory Visit: Payer: Self-pay

## 2021-12-17 ENCOUNTER — Ambulatory Visit (HOSPITAL_COMMUNITY): Payer: BC Managed Care – PPO | Attending: Cardiovascular Disease

## 2021-12-17 DIAGNOSIS — I5022 Chronic systolic (congestive) heart failure: Secondary | ICD-10-CM | POA: Insufficient documentation

## 2021-12-17 DIAGNOSIS — I447 Left bundle-branch block, unspecified: Secondary | ICD-10-CM | POA: Diagnosis not present

## 2021-12-17 DIAGNOSIS — Z95810 Presence of automatic (implantable) cardiac defibrillator: Secondary | ICD-10-CM | POA: Diagnosis not present

## 2021-12-17 DIAGNOSIS — I428 Other cardiomyopathies: Secondary | ICD-10-CM | POA: Insufficient documentation

## 2021-12-17 LAB — ECHOCARDIOGRAM COMPLETE
Area-P 1/2: 4.19 cm2
S' Lateral: 2.7 cm

## 2021-12-21 LAB — CUP PACEART REMOTE DEVICE CHECK
Battery Remaining Longevity: 47 mo
Battery Voltage: 2.97 V
Brady Statistic AP VP Percent: 0.03 %
Brady Statistic AP VS Percent: 0.01 %
Brady Statistic AS VP Percent: 98.62 %
Brady Statistic AS VS Percent: 1.34 %
Brady Statistic RA Percent Paced: 0.04 %
Brady Statistic RV Percent Paced: 8.01 %
Date Time Interrogation Session: 20230323033524
HighPow Impedance: 72 Ohm
Implantable Lead Implant Date: 20190318
Implantable Lead Implant Date: 20190318
Implantable Lead Implant Date: 20190318
Implantable Lead Location: 753858
Implantable Lead Location: 753859
Implantable Lead Location: 753860
Implantable Lead Model: 293
Implantable Lead Model: 5076
Implantable Lead Serial Number: 442838
Implantable Pulse Generator Implant Date: 20190318
Lead Channel Impedance Value: 1026 Ohm
Lead Channel Impedance Value: 1064 Ohm
Lead Channel Impedance Value: 230.327
Lead Channel Impedance Value: 237.686
Lead Channel Impedance Value: 253.786
Lead Channel Impedance Value: 285.934
Lead Channel Impedance Value: 297.365
Lead Channel Impedance Value: 342 Ohm
Lead Channel Impedance Value: 342 Ohm
Lead Channel Impedance Value: 399 Ohm
Lead Channel Impedance Value: 418 Ohm
Lead Channel Impedance Value: 513 Ohm
Lead Channel Impedance Value: 551 Ohm
Lead Channel Impedance Value: 646 Ohm
Lead Channel Impedance Value: 760 Ohm
Lead Channel Impedance Value: 874 Ohm
Lead Channel Impedance Value: 874 Ohm
Lead Channel Impedance Value: 988 Ohm
Lead Channel Pacing Threshold Amplitude: 0.5 V
Lead Channel Pacing Threshold Amplitude: 0.75 V
Lead Channel Pacing Threshold Amplitude: 0.875 V
Lead Channel Pacing Threshold Pulse Width: 0.4 ms
Lead Channel Pacing Threshold Pulse Width: 0.4 ms
Lead Channel Pacing Threshold Pulse Width: 1 ms
Lead Channel Sensing Intrinsic Amplitude: 13.375 mV
Lead Channel Sensing Intrinsic Amplitude: 13.375 mV
Lead Channel Sensing Intrinsic Amplitude: 4.75 mV
Lead Channel Sensing Intrinsic Amplitude: 4.75 mV
Lead Channel Setting Pacing Amplitude: 0.5 V
Lead Channel Setting Pacing Amplitude: 1.25 V
Lead Channel Setting Pacing Amplitude: 2 V
Lead Channel Setting Pacing Pulse Width: 0.03 ms
Lead Channel Setting Pacing Pulse Width: 1 ms
Lead Channel Setting Sensing Sensitivity: 0.6 mV

## 2021-12-28 NOTE — Progress Notes (Signed)
Remote ICD transmission.   

## 2022-01-26 DIAGNOSIS — Z125 Encounter for screening for malignant neoplasm of prostate: Secondary | ICD-10-CM | POA: Diagnosis not present

## 2022-01-26 DIAGNOSIS — I10 Essential (primary) hypertension: Secondary | ICD-10-CM | POA: Diagnosis not present

## 2022-01-26 DIAGNOSIS — R7301 Impaired fasting glucose: Secondary | ICD-10-CM | POA: Diagnosis not present

## 2022-01-26 DIAGNOSIS — M109 Gout, unspecified: Secondary | ICD-10-CM | POA: Diagnosis not present

## 2022-02-03 DIAGNOSIS — R82998 Other abnormal findings in urine: Secondary | ICD-10-CM | POA: Diagnosis not present

## 2022-02-03 DIAGNOSIS — Z0000 Encounter for general adult medical examination without abnormal findings: Secondary | ICD-10-CM | POA: Diagnosis not present

## 2022-02-03 DIAGNOSIS — I10 Essential (primary) hypertension: Secondary | ICD-10-CM | POA: Diagnosis not present

## 2022-02-03 DIAGNOSIS — Z1389 Encounter for screening for other disorder: Secondary | ICD-10-CM | POA: Diagnosis not present

## 2022-02-03 DIAGNOSIS — Z1331 Encounter for screening for depression: Secondary | ICD-10-CM | POA: Diagnosis not present

## 2022-03-18 ENCOUNTER — Ambulatory Visit (INDEPENDENT_AMBULATORY_CARE_PROVIDER_SITE_OTHER): Payer: BC Managed Care – PPO

## 2022-03-18 DIAGNOSIS — I428 Other cardiomyopathies: Secondary | ICD-10-CM

## 2022-03-18 LAB — CUP PACEART REMOTE DEVICE CHECK
Battery Remaining Longevity: 49 mo
Battery Voltage: 2.97 V
Brady Statistic AP VP Percent: 0.03 %
Brady Statistic AP VS Percent: 0.01 %
Brady Statistic AS VP Percent: 98.49 %
Brady Statistic AS VS Percent: 1.47 %
Brady Statistic RA Percent Paced: 0.04 %
Brady Statistic RV Percent Paced: 7.46 %
Date Time Interrogation Session: 20230622033424
HighPow Impedance: 72 Ohm
Implantable Lead Implant Date: 20190318
Implantable Lead Implant Date: 20190318
Implantable Lead Implant Date: 20190318
Implantable Lead Location: 753858
Implantable Lead Location: 753859
Implantable Lead Location: 753860
Implantable Lead Model: 293
Implantable Lead Model: 5076
Implantable Lead Serial Number: 442838
Implantable Pulse Generator Implant Date: 20190318
Lead Channel Impedance Value: 1026 Ohm
Lead Channel Impedance Value: 216.848
Lead Channel Impedance Value: 237.865
Lead Channel Impedance Value: 246.655
Lead Channel Impedance Value: 273.729
Lead Channel Impedance Value: 304 Ohm
Lead Channel Impedance Value: 308.092
Lead Channel Impedance Value: 342 Ohm
Lead Channel Impedance Value: 361 Ohm
Lead Channel Impedance Value: 399 Ohm
Lead Channel Impedance Value: 475 Ohm
Lead Channel Impedance Value: 589 Ohm
Lead Channel Impedance Value: 646 Ohm
Lead Channel Impedance Value: 722 Ohm
Lead Channel Impedance Value: 836 Ohm
Lead Channel Impedance Value: 874 Ohm
Lead Channel Impedance Value: 931 Ohm
Lead Channel Impedance Value: 988 Ohm
Lead Channel Pacing Threshold Amplitude: 0.5 V
Lead Channel Pacing Threshold Amplitude: 0.625 V
Lead Channel Pacing Threshold Amplitude: 0.875 V
Lead Channel Pacing Threshold Pulse Width: 0.4 ms
Lead Channel Pacing Threshold Pulse Width: 0.4 ms
Lead Channel Pacing Threshold Pulse Width: 1 ms
Lead Channel Sensing Intrinsic Amplitude: 12.875 mV
Lead Channel Sensing Intrinsic Amplitude: 12.875 mV
Lead Channel Sensing Intrinsic Amplitude: 3.875 mV
Lead Channel Sensing Intrinsic Amplitude: 3.875 mV
Lead Channel Setting Pacing Amplitude: 0.5 V
Lead Channel Setting Pacing Amplitude: 1.25 V
Lead Channel Setting Pacing Amplitude: 2 V
Lead Channel Setting Pacing Pulse Width: 0.03 ms
Lead Channel Setting Pacing Pulse Width: 1 ms
Lead Channel Setting Sensing Sensitivity: 0.6 mV

## 2022-03-26 NOTE — Progress Notes (Signed)
Remote ICD transmission.   

## 2022-06-17 ENCOUNTER — Ambulatory Visit (INDEPENDENT_AMBULATORY_CARE_PROVIDER_SITE_OTHER): Payer: Commercial Managed Care - PPO

## 2022-06-17 DIAGNOSIS — I428 Other cardiomyopathies: Secondary | ICD-10-CM | POA: Diagnosis not present

## 2022-06-17 LAB — CUP PACEART REMOTE DEVICE CHECK
Battery Remaining Longevity: 43 mo
Battery Voltage: 2.97 V
Brady Statistic AP VP Percent: 0.07 %
Brady Statistic AP VS Percent: 0.01 %
Brady Statistic AS VP Percent: 98.55 %
Brady Statistic AS VS Percent: 1.38 %
Brady Statistic RA Percent Paced: 0.08 %
Brady Statistic RV Percent Paced: 5.65 %
Date Time Interrogation Session: 20230921033429
HighPow Impedance: 72 Ohm
Implantable Lead Implant Date: 20190318
Implantable Lead Implant Date: 20190318
Implantable Lead Implant Date: 20190318
Implantable Lead Location: 753858
Implantable Lead Location: 753859
Implantable Lead Location: 753860
Implantable Lead Model: 293
Implantable Lead Model: 5076
Implantable Lead Serial Number: 442838
Implantable Pulse Generator Implant Date: 20190318
Lead Channel Impedance Value: 1007 Ohm
Lead Channel Impedance Value: 216.848
Lead Channel Impedance Value: 228 Ohm
Lead Channel Impedance Value: 246.655
Lead Channel Impedance Value: 273.729
Lead Channel Impedance Value: 291.742
Lead Channel Impedance Value: 342 Ohm
Lead Channel Impedance Value: 342 Ohm
Lead Channel Impedance Value: 399 Ohm
Lead Channel Impedance Value: 399 Ohm
Lead Channel Impedance Value: 475 Ohm
Lead Channel Impedance Value: 532 Ohm
Lead Channel Impedance Value: 646 Ohm
Lead Channel Impedance Value: 760 Ohm
Lead Channel Impedance Value: 817 Ohm
Lead Channel Impedance Value: 836 Ohm
Lead Channel Impedance Value: 931 Ohm
Lead Channel Impedance Value: 988 Ohm
Lead Channel Pacing Threshold Amplitude: 0.5 V
Lead Channel Pacing Threshold Amplitude: 0.875 V
Lead Channel Pacing Threshold Amplitude: 0.875 V
Lead Channel Pacing Threshold Pulse Width: 0.4 ms
Lead Channel Pacing Threshold Pulse Width: 0.4 ms
Lead Channel Pacing Threshold Pulse Width: 1 ms
Lead Channel Sensing Intrinsic Amplitude: 12.875 mV
Lead Channel Sensing Intrinsic Amplitude: 12.875 mV
Lead Channel Sensing Intrinsic Amplitude: 4.125 mV
Lead Channel Sensing Intrinsic Amplitude: 4.125 mV
Lead Channel Setting Pacing Amplitude: 0.5 V
Lead Channel Setting Pacing Amplitude: 1.5 V
Lead Channel Setting Pacing Amplitude: 2 V
Lead Channel Setting Pacing Pulse Width: 0.03 ms
Lead Channel Setting Pacing Pulse Width: 1 ms
Lead Channel Setting Sensing Sensitivity: 0.6 mV

## 2022-06-28 NOTE — Progress Notes (Signed)
Remote ICD transmission.   

## 2022-07-21 ENCOUNTER — Encounter: Payer: Self-pay | Admitting: Internal Medicine

## 2022-08-02 ENCOUNTER — Encounter: Payer: Self-pay | Admitting: Internal Medicine

## 2022-08-31 ENCOUNTER — Ambulatory Visit (AMBULATORY_SURGERY_CENTER): Payer: Commercial Managed Care - PPO

## 2022-08-31 VITALS — Ht 68.0 in | Wt 219.0 lb

## 2022-08-31 DIAGNOSIS — Z86010 Personal history of colonic polyps: Secondary | ICD-10-CM

## 2022-08-31 MED ORDER — NA SULFATE-K SULFATE-MG SULF 17.5-3.13-1.6 GM/177ML PO SOLN: 1.0000 | Freq: Once | ORAL | 0 refills | Status: AC

## 2022-08-31 NOTE — Progress Notes (Signed)
No egg or soy allergy known to patient;  No issues known to pt with past sedation with any surgeries or procedures; Patient denies ever being told they had issues or difficulty with intubation;  No FH of Malignant Hyperthermia; Pt is not on diet pills; Pt is not on home 02;  Pt is not on blood thinners;  Pt denies issues with constipation;  No A fib or A flutter; Have any cardiac testing pending--NO Pt instructed to use Singlecare.com or GoodRx for a price reduction on prep   Insurance verified during [facility] appt=[employer]  Patient's chart reviewed by Osvaldo Angst CNRA prior to previsit and patient appropriate for the [facility].  Previsit completed and red dot placed by patient's name on their procedure day (on provider's schedule).    GoodRx coupon given to patient during PV appt;

## 2022-09-16 ENCOUNTER — Ambulatory Visit (INDEPENDENT_AMBULATORY_CARE_PROVIDER_SITE_OTHER): Payer: Commercial Managed Care - PPO

## 2022-09-16 DIAGNOSIS — I428 Other cardiomyopathies: Secondary | ICD-10-CM

## 2022-09-17 LAB — CUP PACEART REMOTE DEVICE CHECK
Battery Remaining Longevity: 38 mo
Battery Voltage: 2.96 V
Brady Statistic AP VP Percent: 0.06 %
Brady Statistic AP VS Percent: 0.01 %
Brady Statistic AS VP Percent: 98.53 %
Brady Statistic AS VS Percent: 1.41 %
Brady Statistic RA Percent Paced: 0.06 %
Brady Statistic RV Percent Paced: 5.63 %
Date Time Interrogation Session: 20231221043824
HighPow Impedance: 70 Ohm
Implantable Lead Connection Status: 753985
Implantable Lead Connection Status: 753985
Implantable Lead Connection Status: 753985
Implantable Lead Implant Date: 20190318
Implantable Lead Implant Date: 20190318
Implantable Lead Implant Date: 20190318
Implantable Lead Location: 753858
Implantable Lead Location: 753859
Implantable Lead Location: 753860
Implantable Lead Model: 293
Implantable Lead Model: 5076
Implantable Lead Serial Number: 442838
Implantable Pulse Generator Implant Date: 20190318
Lead Channel Impedance Value: 1007 Ohm
Lead Channel Impedance Value: 1026 Ohm
Lead Channel Impedance Value: 1026 Ohm
Lead Channel Impedance Value: 222.34 Ohm
Lead Channel Impedance Value: 234.08 Ohm
Lead Channel Impedance Value: 256.667
Lead Channel Impedance Value: 277.083
Lead Channel Impedance Value: 295.556
Lead Channel Impedance Value: 304 Ohm
Lead Channel Impedance Value: 342 Ohm
Lead Channel Impedance Value: 399 Ohm
Lead Channel Impedance Value: 418 Ohm
Lead Channel Impedance Value: 475 Ohm
Lead Channel Impedance Value: 532 Ohm
Lead Channel Impedance Value: 665 Ohm
Lead Channel Impedance Value: 760 Ohm
Lead Channel Impedance Value: 836 Ohm
Lead Channel Impedance Value: 836 Ohm
Lead Channel Pacing Threshold Amplitude: 0.5 V
Lead Channel Pacing Threshold Amplitude: 0.75 V
Lead Channel Pacing Threshold Amplitude: 0.875 V
Lead Channel Pacing Threshold Pulse Width: 0.4 ms
Lead Channel Pacing Threshold Pulse Width: 0.4 ms
Lead Channel Pacing Threshold Pulse Width: 1 ms
Lead Channel Sensing Intrinsic Amplitude: 12.75 mV
Lead Channel Sensing Intrinsic Amplitude: 12.75 mV
Lead Channel Sensing Intrinsic Amplitude: 4.375 mV
Lead Channel Sensing Intrinsic Amplitude: 4.375 mV
Lead Channel Setting Pacing Amplitude: 0.5 V
Lead Channel Setting Pacing Amplitude: 1.25 V
Lead Channel Setting Pacing Amplitude: 2 V
Lead Channel Setting Pacing Pulse Width: 0.03 ms
Lead Channel Setting Pacing Pulse Width: 1 ms
Lead Channel Setting Sensing Sensitivity: 0.6 mV
Zone Setting Status: 755011
Zone Setting Status: 755011

## 2022-09-30 ENCOUNTER — Encounter: Payer: Self-pay | Admitting: Internal Medicine

## 2022-10-01 ENCOUNTER — Encounter: Payer: Self-pay | Admitting: Internal Medicine

## 2022-10-01 ENCOUNTER — Ambulatory Visit (AMBULATORY_SURGERY_CENTER): Payer: Commercial Managed Care - PPO | Admitting: Internal Medicine

## 2022-10-01 VITALS — BP 96/61 | HR 67 | Resp 7

## 2022-10-01 DIAGNOSIS — Z86010 Personal history of colonic polyps: Secondary | ICD-10-CM | POA: Diagnosis not present

## 2022-10-01 DIAGNOSIS — Z09 Encounter for follow-up examination after completed treatment for conditions other than malignant neoplasm: Secondary | ICD-10-CM

## 2022-10-01 MED ORDER — SODIUM CHLORIDE 0.9 % IV SOLN: 500.0000 mL | Freq: Once | INTRAVENOUS | Status: DC

## 2022-10-01 NOTE — Progress Notes (Signed)
Pt's states no medical or surgical changes since previsit or office visit. 

## 2022-10-01 NOTE — Progress Notes (Signed)
GASTROENTEROLOGY PROCEDURE H&P NOTE   Primary Care Physician: Ginger Organ., MD    Reason for Procedure:  History of adenomatous colon polyp  Plan:    Surveillance colonoscopy  Patient is appropriate for endoscopic procedure(s) in the ambulatory ([place] [place]) setting.  The nature of the procedure, as well as the risks, benefits, and alternatives were carefully and thoroughly reviewed with the patient. Ample time for discussion and questions allowed. The patient understood, was satisfied, and agreed to proceed.     HPI: Joseph Parrish is a 57 y.o. male who presents for surveillance colonoscopy.  Medical history as below.  Tolerated the prep.  No recent chest pain or shortness of breath.  No abdominal pain today.  Past Medical History:  Diagnosis Date   CHF (congestive heart failure) (HCC)    cardiomyopathy-on meds   Cholecystitis    Cholelithiasis    Diverticulosis    GERD (gastroesophageal reflux disease)    diet controlled-OTC meds PRN   Gout    Headache    migraines   History of kidney stones feb 2018 passed    Hypertension    on meds   Seasonal allergies    Sleep apnea    new dx sept 2018 looking at dental appliances for mild osa-uses CPAP    Past Surgical History:  Procedure Laterality Date   BIV ICD INSERTION CRT-D N/A 12/12/2017   Procedure: BIV ICD INSERTION CRT-D;  Surgeon: Deboraha Sprang, MD;  Location: [place];  Service: Cardiovascular;  Laterality: N/A;   CHOLECYSTECTOMY  2018   COLONOSCOPY WITH PROPOFOL N/A 07/26/2017   Procedure: COLONOSCOPY WITH PROPOFOL;  Surgeon: Jerene Bears, MD;  Location: [facility];  Service: Gastroenterology;  Laterality: N/A;   LEFT HEART CATH AND CORONARY ANGIOGRAPHY N/A 04/08/2017   Procedure: Left Heart Cath and Coronary Angiography;  Surgeon: Jolaine Artist, MD;  Location: [facility];  Service: Cardiovascular;  Laterality: N/A;   TONSILLECTOMY AND [place]    Prior to Admission medications   Medication Sig Start Date End Date Taking? Authorizing Provider  allopurinol (ZYLOPRIM) 300 MG tablet Take 300 mg by mouth daily.   Yes [provider]  carvedilol (COREG) 6.25 MG tablet TAKE 1 TABLET (6.25 MG TOTAL) BY MOUTH 2 TIMES DAILY WITH A MEAL. MUST BE SEEN FOR FURTHER REFILLS 08/04/20  Yes Bensimhon, Shaune Pascal, MD  furosemide (LASIX) 20 MG tablet Take 40 mg by mouth daily.   Yes [provider]  loratadine (CLARITIN) 10 MG tablet Take 10 mg by mouth at bedtime.    Yes [provider]  losartan (COZAAR) 100 MG tablet Take 1 tablet (100 mg total) by mouth daily. 11/11/20  Yes Bensimhon, Shaune Pascal, MD  acetaminophen (TYLENOL) 500 MG tablet Take 500-1,000 mg by mouth every 6 (six) hours as needed for moderate pain or headache.     [provider]  colchicine 0.6 MG tablet Take 0.6 mg daily as needed by mouth (for gout flares).    [provider]  ibuprofen (ADVIL,MOTRIN) 200 MG tablet Take 600 mg by mouth every 8 (eight) hours as needed for headache or moderate pain.     [provider]  SUMAtriptan (IMITREX) 100 MG tablet Take 100 mg by mouth every 2 (two) hours as needed for migraine. 09/06/13   [provider]    Current Outpatient Medications  Medication Sig Dispense Refill   allopurinol (ZYLOPRIM) 300  MG tablet Take 300 mg by mouth daily.     carvedilol (COREG) 6.25 MG tablet TAKE 1 TABLET (6.25 MG TOTAL) BY MOUTH 2 TIMES DAILY WITH A MEAL. MUST BE SEEN FOR FURTHER REFILLS 180 tablet 0   furosemide (LASIX) 20 MG tablet Take 40 mg by mouth daily.     loratadine (CLARITIN) 10 MG tablet Take 10 mg by mouth at bedtime.      losartan (COZAAR) 100 MG tablet Take 1 tablet (100 mg total) by mouth daily. 60 tablet 3   acetaminophen (TYLENOL) 500 MG tablet Take 500-1,000 mg by mouth every 6 (six) hours as needed for moderate pain or headache.      colchicine 0.6 MG tablet Take 0.6 mg daily as  needed by mouth (for gout flares).     ibuprofen (ADVIL,MOTRIN) 200 MG tablet Take 600 mg by mouth every 8 (eight) hours as needed for headache or moderate pain.      SUMAtriptan (IMITREX) 100 MG tablet Take 100 mg by mouth every 2 (two) hours as needed for migraine.     Current Facility-Administered Medications  Medication Dose Route Frequency Provider Last Rate Last Admin   0.9 %  sodium chloride infusion  500 mL Intravenous Once Violetta Lavalle, Lajuan Lines, MD        Allergies as of 10/01/2022   (No Active Allergies)    Family History  Problem Relation Age of Onset   Endometrial cancer Mother 80   Aneurysm Father 59   Stroke Paternal Grandfather    Colon cancer Neg Hx    Colon polyps Neg Hx    Esophageal cancer Neg Hx    Rectal cancer Neg Hx    Stomach cancer Neg Hx     Social History   Socioeconomic History   Marital status: Single    Spouse name: Not on file   Number of children: Not on file   Years of education: Not on file   Highest education level: Not on file  Occupational History   Not on file  Tobacco Use   Smoking status: Never   Smokeless tobacco: Never  Vaping Use   Vaping Use: Never used  Substance and Sexual Activity   Alcohol use: Not Currently    Alcohol/week: 0.0 - 6.0 standard drinks of alcohol    Comment: socially   Drug use: No   Sexual activity: Not on file  Other Topics Concern   Not on file  Social History Narrative   Not on file   Social Determinants of Health   Financial Resource Strain: Not on file  Food Insecurity: Not on file  Transportation Needs: Not on file  Physical Activity: Insufficiently Active (12/12/2017)   Exercise Vital Sign    Days of Exercise per Week: 3 days    Minutes of Exercise per Session: 30 min  Stress: No Stress Concern Present (12/12/2017)   [place]    Feeling of Stress : Not at all  Social Connections: Unknown (12/12/2017)   Social Connection and  Isolation Panel [NHANES]    Frequency of Communication with Friends and Family: Patient refused    Frequency of Social Gatherings with Friends and Family: Patient refused    Attends Religious Services: Patient refused    Active Member of Clubs or Organizations: Patient refused    Attends Archivist Meetings: Patient refused    Marital Status: Patient refused  Intimate Partner Violence: Unknown (12/12/2017)   Humiliation, Afraid, Rape,  and Kick questionnaire    Fear of Current or Ex-Partner: Patient refused    Emotionally Abused: Patient refused    Physically Abused: Patient refused    Sexually Abused: Patient refused    Physical Exam: Vital signs in last 24 hours: '@There'$  were no vitals taken for this visit. GEN: NAD EYE: Sclerae anicteric ENT: MMM CV: Non-tachycardic Pulm: CTA b/l GI: Soft, NT/ND NEURO:  Alert & Oriented x 3   Zenovia Jarred, MD [facility]  10/01/2022 11:05 AM

## 2022-10-01 NOTE — Patient Instructions (Signed)
Patient has a contact number available for emergencies. The signs and symptoms of potential delayed complications were discussed with the patient. Return to normal activities tomorrow. Written discharge instructions were provided to the patient. - Resume previous diet. - Continue present medications. - Repeat colonoscopy in 10 years for surveillance  YOU HAD AN ENDOSCOPIC PROCEDURE TODAY: Refer to the procedure report and other information in the discharge instructions given to you for any specific questions about what was found during the examination. If this information does not answer your questions, please call [facility] office at [phone] to clarify.   YOU SHOULD EXPECT: Some feelings of bloating in the abdomen. Passage of more gas than usual. Walking can help get rid of the air that was put into your GI tract during the procedure and reduce the bloating. If you had a lower endoscopy (such as a colonoscopy or flexible sigmoidoscopy) you may notice spotting of blood in your stool or on the toilet paper. Some abdominal soreness may be present for a day or two, also.  DIET: Your first meal following the procedure should be a light meal and then it is ok to progress to your normal diet. A half-sandwich or bowl of soup is an example of a good first meal. Heavy or fried foods are harder to digest and may make you feel nauseous or bloated. Drink plenty of fluids but you should avoid alcoholic beverages for 24 hours. If you had a esophageal dilation, please see attached instructions for diet.    ACTIVITY: Your care partner should take you home directly after the procedure. You should plan to take it easy, moving slowly for the rest of the day. You can resume normal activity the day after the procedure however YOU SHOULD NOT DRIVE, use power tools, machinery or perform tasks that involve climbing or major physical exertion for 24 hours (because of the sedation medicines used during the test).   SYMPTOMS  TO REPORT IMMEDIATELY: A gastroenterologist can be reached at any hour. Please call [phone]  for any of the following symptoms:  Following lower endoscopy (colonoscopy, flexible sigmoidoscopy) Excessive amounts of blood in the stool  Significant tenderness, worsening of abdominal pains  Swelling of the abdomen that is new, acute  Fever of 100 or higher   FOLLOW UP:  If any biopsies were taken you will be contacted by phone or by letter within the next 1-3 weeks. Call [phone]  if you have not heard about the biopsies in 3 weeks.  Please also call with any specific questions about appointments or follow up tests.

## 2022-10-01 NOTE — Progress Notes (Signed)
Report to pacu rn. Vss. Care resumed by rn. 

## 2022-10-01 NOTE — Op Note (Signed)
[facility] Patient Name: Joseph Parrish Procedure Date: 10/01/2022 11:10 AM MRN: [id number] Endoscopist: Jerene Bears , MD, [id number] Age: 57 Referring MD:  Date of Birth: 08-25-1966 Gender: Male Account #: [id number] Procedure:                Colonoscopy Indications:              High risk colon cancer surveillance: Personal                            history of non-advanced adenoma, Last colonoscopy:                            October 2018 Medicines:                Monitored Anesthesia Care Procedure:                Pre-Anesthesia Assessment:                           - Prior to the procedure, a History and Physical                            was performed, and patient medications and                            allergies were reviewed. The patient's tolerance of                            previous anesthesia was also reviewed. The risks                            and benefits of the procedure and the sedation                            options and risks were discussed with the patient.                            All questions were answered, and informed consent                            was obtained. Prior Anticoagulants: The patient has                            taken no anticoagulant or antiplatelet agents. ASA                            Grade Assessment: III - A patient with severe                            systemic disease. After reviewing the risks and                            benefits, the patient was deemed in satisfactory  condition to undergo the procedure.                           After obtaining informed consent, the colonoscope                            was passed under direct vision. Throughout the                            procedure, the patient's blood pressure, pulse, and                            oxygen saturations were monitored continuously. The                            CF [id number] #[id number] was introduced through the  anus                            and advanced to the cecum, identified by                            appendiceal orifice and ileocecal valve. The                            colonoscopy was performed without difficulty. The                            patient tolerated the procedure well. The quality                            of the bowel preparation was excellent. The                            ileocecal valve, appendiceal orifice, and rectum                            were photographed. Scope In: 11:15:47 AM Scope Out: 11:28:23 AM Scope Withdrawal Time: 0 hours 9 minutes 44 seconds  Total Procedure Duration: 0 hours 12 minutes 36 seconds  Findings:                 The digital rectal exam was normal.                           The entire examined colon appeared normal on direct                            and retroflexion views. Complications:            No immediate complications. Estimated Blood Loss:     Estimated blood loss: none. Impression:               - The entire examined colon is normal on direct and                            retroflexion views.                           -  No specimens collected. Recommendation:           - Patient has a contact number available for                            emergencies. The signs and symptoms of potential                            delayed complications were discussed with the                            patient. Return to normal activities tomorrow.                            Written discharge instructions were provided to the                            patient.                           - Resume previous diet.                           - Continue present medications.                           - Repeat colonoscopy in 10 years for surveillance. Jerene Bears, MD 10/01/2022 11:30:31 AM This report has been signed electronically.

## 2022-10-04 ENCOUNTER — Telehealth: Payer: Self-pay

## 2022-10-04 NOTE — Telephone Encounter (Signed)
Follow up call to pt, lm for pt to call if having any difficulty with normal activities or eating and drinking.  Also to call if any other questions or concerns.  

## 2022-10-07 NOTE — Progress Notes (Signed)
Remote ICD transmission.   

## 2022-12-16 ENCOUNTER — Ambulatory Visit (INDEPENDENT_AMBULATORY_CARE_PROVIDER_SITE_OTHER): Payer: Commercial Managed Care - PPO

## 2022-12-16 DIAGNOSIS — I428 Other cardiomyopathies: Secondary | ICD-10-CM | POA: Diagnosis not present

## 2022-12-17 LAB — CUP PACEART REMOTE DEVICE CHECK
Battery Remaining Longevity: 33 mo
Battery Voltage: 2.95 V
Brady Statistic AP VP Percent: 0.04 %
Brady Statistic AP VS Percent: 0.01 %
Brady Statistic AS VP Percent: 98.64 %
Brady Statistic AS VS Percent: 1.31 %
Brady Statistic RA Percent Paced: 0.05 %
Brady Statistic RV Percent Paced: 6.94 %
Date Time Interrogation Session: 20240321012207
HighPow Impedance: 73 Ohm
Implantable Lead Connection Status: 753985
Implantable Lead Connection Status: 753985
Implantable Lead Connection Status: 753985
Implantable Lead Implant Date: 20190318
Implantable Lead Implant Date: 20190318
Implantable Lead Implant Date: 20190318
Implantable Lead Location: 753858
Implantable Lead Location: 753859
Implantable Lead Location: 753860
Implantable Lead Model: 293
Implantable Lead Model: 5076
Implantable Lead Serial Number: 442838
Implantable Pulse Generator Implant Date: 20190318
Lead Channel Impedance Value: 1026 Ohm
Lead Channel Impedance Value: 1064 Ohm
Lead Channel Impedance Value: 222.34 Ohm
Lead Channel Impedance Value: 234.08 Ohm
Lead Channel Impedance Value: 253.786
Lead Channel Impedance Value: 273.729
Lead Channel Impedance Value: 291.742
Lead Channel Impedance Value: 342 Ohm
Lead Channel Impedance Value: 342 Ohm
Lead Channel Impedance Value: 399 Ohm
Lead Channel Impedance Value: 418 Ohm
Lead Channel Impedance Value: 475 Ohm
Lead Channel Impedance Value: 532 Ohm
Lead Channel Impedance Value: 646 Ohm
Lead Channel Impedance Value: 722 Ohm
Lead Channel Impedance Value: 817 Ohm
Lead Channel Impedance Value: 836 Ohm
Lead Channel Impedance Value: 988 Ohm
Lead Channel Pacing Threshold Amplitude: 0.5 V
Lead Channel Pacing Threshold Amplitude: 0.75 V
Lead Channel Pacing Threshold Amplitude: 0.875 V
Lead Channel Pacing Threshold Pulse Width: 0.4 ms
Lead Channel Pacing Threshold Pulse Width: 0.4 ms
Lead Channel Pacing Threshold Pulse Width: 1 ms
Lead Channel Sensing Intrinsic Amplitude: 12.625 mV
Lead Channel Sensing Intrinsic Amplitude: 12.625 mV
Lead Channel Sensing Intrinsic Amplitude: 4.5 mV
Lead Channel Sensing Intrinsic Amplitude: 4.5 mV
Lead Channel Setting Pacing Amplitude: 0.5 V
Lead Channel Setting Pacing Amplitude: 1.25 V
Lead Channel Setting Pacing Amplitude: 2 V
Lead Channel Setting Pacing Pulse Width: 0.03 ms
Lead Channel Setting Pacing Pulse Width: 1 ms
Lead Channel Setting Sensing Sensitivity: 0.6 mV
Zone Setting Status: 755011
Zone Setting Status: 755011

## 2023-01-19 NOTE — Progress Notes (Signed)
Remote ICD transmission.   

## 2023-02-15 ENCOUNTER — Telehealth: Payer: Self-pay | Admitting: Internal Medicine

## 2023-02-15 DIAGNOSIS — I428 Other cardiomyopathies: Secondary | ICD-10-CM

## 2023-02-15 NOTE — Telephone Encounter (Signed)
Patient was overdue for EP appt and has been scheduled, but it was stated on his recall that he would need echo prior to appt. Requesting call back to discuss if this is still needed and to schedule. Please advise.

## 2023-02-15 NOTE — Telephone Encounter (Signed)
Echo order placed and sent to scheduling.

## 2023-03-17 ENCOUNTER — Ambulatory Visit (INDEPENDENT_AMBULATORY_CARE_PROVIDER_SITE_OTHER): Payer: Commercial Managed Care - PPO

## 2023-03-17 DIAGNOSIS — I428 Other cardiomyopathies: Secondary | ICD-10-CM | POA: Diagnosis not present

## 2023-03-17 LAB — CUP PACEART REMOTE DEVICE CHECK
Battery Remaining Longevity: 29 mo
Battery Voltage: 2.95 V
Brady Statistic AP VP Percent: 0.1 %
Brady Statistic AP VS Percent: 0.01 %
Brady Statistic AS VP Percent: 98.44 %
Brady Statistic AS VS Percent: 1.45 %
Brady Statistic RA Percent Paced: 0.1 %
Brady Statistic RV Percent Paced: 7.32 %
Date Time Interrogation Session: 20240620022604
HighPow Impedance: 70 Ohm
Implantable Lead Connection Status: 753985
Implantable Lead Connection Status: 753985
Implantable Lead Connection Status: 753985
Implantable Lead Implant Date: 20190318
Implantable Lead Implant Date: 20190318
Implantable Lead Implant Date: 20190318
Implantable Lead Location: 753858
Implantable Lead Location: 753859
Implantable Lead Location: 753860
Implantable Lead Model: 293
Implantable Lead Model: 5076
Implantable Lead Serial Number: 442838
Implantable Pulse Generator Implant Date: 20190318
Lead Channel Impedance Value: 1007 Ohm
Lead Channel Impedance Value: 218.087
Lead Channel Impedance Value: 237.686
Lead Channel Impedance Value: 247.704
Lead Channel Impedance Value: 260.571
Lead Channel Impedance Value: 289.049
Lead Channel Impedance Value: 304 Ohm
Lead Channel Impedance Value: 304 Ohm
Lead Channel Impedance Value: 361 Ohm
Lead Channel Impedance Value: 418 Ohm
Lead Channel Impedance Value: 456 Ohm
Lead Channel Impedance Value: 551 Ohm
Lead Channel Impedance Value: 608 Ohm
Lead Channel Impedance Value: 722 Ohm
Lead Channel Impedance Value: 836 Ohm
Lead Channel Impedance Value: 874 Ohm
Lead Channel Impedance Value: 893 Ohm
Lead Channel Impedance Value: 988 Ohm
Lead Channel Pacing Threshold Amplitude: 0.5 V
Lead Channel Pacing Threshold Amplitude: 0.625 V
Lead Channel Pacing Threshold Amplitude: 0.875 V
Lead Channel Pacing Threshold Pulse Width: 0.4 ms
Lead Channel Pacing Threshold Pulse Width: 0.4 ms
Lead Channel Pacing Threshold Pulse Width: 1 ms
Lead Channel Sensing Intrinsic Amplitude: 14.25 mV
Lead Channel Sensing Intrinsic Amplitude: 14.25 mV
Lead Channel Sensing Intrinsic Amplitude: 4.5 mV
Lead Channel Sensing Intrinsic Amplitude: 4.5 mV
Lead Channel Setting Pacing Amplitude: 0.5 V
Lead Channel Setting Pacing Amplitude: 1.25 V
Lead Channel Setting Pacing Amplitude: 2 V
Lead Channel Setting Pacing Pulse Width: 0.03 ms
Lead Channel Setting Pacing Pulse Width: 1 ms
Lead Channel Setting Sensing Sensitivity: 0.6 mV
Zone Setting Status: 755011
Zone Setting Status: 755011

## 2023-04-06 NOTE — Progress Notes (Signed)
Remote ICD transmission.   

## 2023-04-11 ENCOUNTER — Ambulatory Visit (HOSPITAL_COMMUNITY): Payer: Commercial Managed Care - PPO | Attending: Cardiology

## 2023-04-11 ENCOUNTER — Ambulatory Visit: Payer: Commercial Managed Care - PPO | Admitting: Student

## 2023-04-11 DIAGNOSIS — I428 Other cardiomyopathies: Secondary | ICD-10-CM

## 2023-04-11 LAB — ECHOCARDIOGRAM COMPLETE
Area-P 1/2: 3.53 cm2
S' Lateral: 3.3 cm

## 2023-04-25 ENCOUNTER — Other Ambulatory Visit (HOSPITAL_COMMUNITY): Payer: Self-pay | Admitting: Orthopedic Surgery

## 2023-04-25 DIAGNOSIS — S83249A Other tear of medial meniscus, current injury, unspecified knee, initial encounter: Secondary | ICD-10-CM

## 2023-05-09 ENCOUNTER — Encounter: Payer: Self-pay | Admitting: Internal Medicine

## 2023-05-09 ENCOUNTER — Ambulatory Visit: Payer: Commercial Managed Care - PPO | Admitting: Internal Medicine

## 2023-05-09 VITALS — BP 112/78 | HR 71 | Ht 68.0 in | Wt 221.6 lb

## 2023-05-09 DIAGNOSIS — I447 Left bundle-branch block, unspecified: Secondary | ICD-10-CM | POA: Diagnosis not present

## 2023-05-09 DIAGNOSIS — I428 Other cardiomyopathies: Secondary | ICD-10-CM

## 2023-05-09 DIAGNOSIS — I5022 Chronic systolic (congestive) heart failure: Secondary | ICD-10-CM

## 2023-05-09 LAB — CUP PACEART INCLINIC DEVICE CHECK
Battery Remaining Longevity: 31 mo
Battery Voltage: 2.9 V
Brady Statistic AP VP Percent: 0.05 %
Brady Statistic AP VS Percent: 0.01 %
Brady Statistic AS VP Percent: 98.54 %
Brady Statistic AS VS Percent: 1.4 %
Brady Statistic RA Percent Paced: 0.06 %
Brady Statistic RV Percent Paced: 7 %
Date Time Interrogation Session: 20240812110435
HighPow Impedance: 72 Ohm
Implantable Lead Connection Status: 753985
Implantable Lead Connection Status: 753985
Implantable Lead Connection Status: 753985
Implantable Lead Implant Date: 20190318
Implantable Lead Implant Date: 20190318
Implantable Lead Implant Date: 20190318
Implantable Lead Location: 753858
Implantable Lead Location: 753859
Implantable Lead Location: 753860
Implantable Lead Model: 293
Implantable Lead Model: 5076
Implantable Lead Serial Number: 442838
Implantable Pulse Generator Implant Date: 20190318
Lead Channel Impedance Value: 1026 Ohm
Lead Channel Impedance Value: 222.34 Ohm
Lead Channel Impedance Value: 234.08 Ohm
Lead Channel Impedance Value: 247.704
Lead Channel Impedance Value: 266.667
Lead Channel Impedance Value: 283.733
Lead Channel Impedance Value: 342 Ohm
Lead Channel Impedance Value: 342 Ohm
Lead Channel Impedance Value: 361 Ohm
Lead Channel Impedance Value: 418 Ohm
Lead Channel Impedance Value: 475 Ohm
Lead Channel Impedance Value: 532 Ohm
Lead Channel Impedance Value: 608 Ohm
Lead Channel Impedance Value: 760 Ohm
Lead Channel Impedance Value: 817 Ohm
Lead Channel Impedance Value: 817 Ohm
Lead Channel Impedance Value: 988 Ohm
Lead Channel Impedance Value: 988 Ohm
Lead Channel Pacing Threshold Amplitude: 0.25 V
Lead Channel Pacing Threshold Amplitude: 0.5 V
Lead Channel Pacing Threshold Amplitude: 0.75 V
Lead Channel Pacing Threshold Amplitude: 0.875 V
Lead Channel Pacing Threshold Pulse Width: 0.03 ms
Lead Channel Pacing Threshold Pulse Width: 0.4 ms
Lead Channel Pacing Threshold Pulse Width: 0.4 ms
Lead Channel Pacing Threshold Pulse Width: 1 ms
Lead Channel Sensing Intrinsic Amplitude: 13.5 mV
Lead Channel Sensing Intrinsic Amplitude: 15.625 mV
Lead Channel Sensing Intrinsic Amplitude: 4.125 mV
Lead Channel Sensing Intrinsic Amplitude: 5.5 mV
Lead Channel Setting Pacing Amplitude: 0.5 V
Lead Channel Setting Pacing Amplitude: 1.25 V
Lead Channel Setting Pacing Amplitude: 2 V
Lead Channel Setting Pacing Pulse Width: 0.03 ms
Lead Channel Setting Pacing Pulse Width: 1 ms
Lead Channel Setting Sensing Sensitivity: 0.6 mV
Zone Setting Status: 755011
Zone Setting Status: 755011

## 2023-05-09 NOTE — Patient Instructions (Signed)
Medication Instructions:  Your physician recommends that you continue on your current medications as directed. Please refer to the Current Medication list given to you today.  *If you need a refill on your cardiac medications before your next appointment, please call your pharmacy*  Follow-Up: At [facility], you and your health needs are our priority.  As part of our continuing mission to provide you with exceptional heart care, we have created designated Provider Care Teams.  These Care Teams include your primary Cardiologist (physician) and Advanced Practice Providers (APPs -  Physician Assistants and Nurse Practitioners) who all work together to provide you with the care you need, when you need it.  Your next appointment:   1 year  Provider:   You may see Sherryl Manges, MD or one of the following Advanced Practice Providers on your designated Care Team:   Francis Dowse, [place] [address]" [place], [place] Sherie Don, NP Canary Brim, NP

## 2023-05-09 NOTE — Progress Notes (Signed)
Patient Care Team: Cleatis Polka., MD as PCP - General (Internal Medicine) Bensimhon, Bevelyn Buckles, MD as PCP - Cardiology (Cardiology) Duke Salvia, MD as PCP - Electrophysiology (Cardiology)   HPI  Joseph Parrish is a 57 y.o. male Seen in follow-up for CRT-D for nonischemic cardiomyopathy and left bundle branch block with interval near normalization of LV function.   Is currently programmed some subthreshold RV adaptive CRT so that he has essentially only LV pacing The patient denies chest pain, shortness of breath, nocturnal dyspnea, orthopnea.  There have been , lightheadedness or syncope.  Complains of some peripheral edema.  In this regard salt intake is copious.  Palpitations occasionally at night.  Disturbing.  Currently quiescient.  Notes that his alcohol intake can be exuberant not sure if there is a correlation.Marland Kitchen   DATE TEST  EF    6/18 Echo   25 %    7/18 Cath    40 % Normal CA  11/18 cMRI  26 % LGE neg  6/19 Echo  45-50%   4/21 Echo  55-60%   7/24 Echo  60-65%     Date Cr K Hgb  3/19 1.39 4.8 15.8  4/21  1.04 4.7           Past Medical History:  Diagnosis Date   CHF (congestive heart failure) (HCC)    cardiomyopathy-on meds   Cholecystitis    Cholelithiasis    Diverticulosis    GERD (gastroesophageal reflux disease)    diet controlled-OTC meds PRN   Gout    Headache    migraines   History of kidney stones feb 2018 passed    Hypertension    on meds   Seasonal allergies    Sleep apnea    new dx sept 2018 looking at dental appliances for mild osa-uses CPAP    Past Surgical History:  Procedure Laterality Date   BIV ICD INSERTION CRT-D N/A 12/12/2017   Procedure: BIV ICD INSERTION CRT-D;  Surgeon: Duke Salvia, MD;  [facility] INVAS[facility] CV LAB;  Service: Cardiovascular;  Laterality: N/A;   CHOLECYSTECTOMY  2018   COLONOSCOPY WITH PROPOFOL N/A 07/26/2017   Procedure: COLONOSCOPY WITH PROPOFOL;  Surgeon: Beverley Fiedler, MD;  Location: [facility]  ENDOSCOPY;  Service: Gastroenterology;  Laterality: N/A;   LEFT HEART CATH AND CORONARY ANGIOGRAPHY N/A 04/08/2017   Procedure: Left Heart Cath and Coronary Angiography;  Surgeon: Dolores Patty, MD;  Location: [facility] INVASIVE CV LAB;  Service: Cardiovascular;  Laterality: N/A;   TONSILLECTOMY AND ADENOIDECTOMY  1975   WISDOM TOOTH EXTRACTION  1985    Current Meds  Medication Sig   acetaminophen (TYLENOL) 500 MG tablet Take 500-1,000 mg by mouth every 6 (six) hours as needed for moderate pain or headache.    allopurinol (ZYLOPRIM) 300 MG tablet Take 300 mg by mouth daily.   carvedilol (COREG) 6.25 MG tablet TAKE 1 TABLET (6.25 MG TOTAL) BY MOUTH 2 TIMES DAILY WITH A MEAL. MUST BE SEEN FOR FURTHER REFILLS   colchicine 0.6 MG tablet Take 0.6 mg daily as needed by mouth (for gout flares).   furosemide (LASIX) 20 MG tablet Take 60 mg by mouth daily.   ibuprofen (ADVIL,MOTRIN) 200 MG tablet Take 600 mg by mouth every 8 (eight) hours as needed for headache or moderate pain.    loratadine (CLARITIN) 10 MG tablet Take 10 mg by mouth at bedtime.    losartan (COZAAR) 100 MG tablet Take 1 tablet (  100 mg total) by mouth daily.   meloxicam (MOBIC) 15 MG tablet Take 15 mg by mouth. As needed   SUMAtriptan (IMITREX) 100 MG tablet Take 100 mg by mouth every 2 (two) hours as needed for migraine.    No Active Allergies    Review of Systems negative except from HPI and PMH  Physical Exam BP 112/78   Pulse 71   Ht 5\' 8"  (1.727 m)   Wt 221 lb 9.6 oz (100.5 kg)   SpO2 95%   BMI 33.69 kg/m  Well developed and well nourished in no acute distress HENT normal Neck supple with JVP-flat Clear Device pocket well healed; without hematoma or erythema.  There is no tethering  Regular rate and rhythm, no  gallop No  murmur Abd-soft with active BS No Clubbing cyanosis  edema Skin-warm and dry A & Oriented  Grossly normal sensory and motor function  ECG sinus w P-synchronous/ AV  pacing QRSd 148 msec  with qR lead 1 and rS lead V1  Device function is normal. Programming changes none  See Paceart for details    4/19 neg QRS lead 1 and upright V1 Assessment and  Plan  LBBB  NICM recHF  PVCs 10 % >= ( does not include run)-  CRT -[employer]tronic QRS is much shorter with aCRT than Biv   Sinus node dysfunction  HFrecEF  palpitations    There is recovery of LV function.  Will maintain his losartan and carvedilol.  Have reached out to Dr. Dorthea Cove, regarding data on recovered ejection fraction and utility of SGLT2.  Presumably that they would be beneficial   PVCs are largely quiescent based on histograms.  Continue carvedilol  Sinus node function adequate with reasonable heart rate excursion  Intermittent edema.  Continue with furosemide 60 mg daily.  We have discussed the physiology of heart failure including the importance of salt restriction and fluid restriction and have reviewed sources of dietary salt and water.  Palpitations in the evening might be related to alcohol.  Notably however, on the histograms there was a pacing "blip "90-100 bpm; noncompetitive atrial pacing is on and this could be responding then to PACs perhaps also related to alcohol.  Encouraged him to decrease

## 2023-06-16 ENCOUNTER — Ambulatory Visit (INDEPENDENT_AMBULATORY_CARE_PROVIDER_SITE_OTHER): Payer: Commercial Managed Care - PPO

## 2023-06-16 DIAGNOSIS — I428 Other cardiomyopathies: Secondary | ICD-10-CM

## 2023-06-16 LAB — CUP PACEART REMOTE DEVICE CHECK
Battery Remaining Longevity: 27 mo
Battery Voltage: 2.95 V
Brady Statistic AP VP Percent: 0.25 %
Brady Statistic AP VS Percent: 0.01 %
Brady Statistic AS VP Percent: 98.29 %
Brady Statistic AS VS Percent: 1.45 %
Brady Statistic RA Percent Paced: 0.26 %
Brady Statistic RV Percent Paced: 4.56 %
Date Time Interrogation Session: 20240919012404
HighPow Impedance: 81 Ohm
Implantable Lead Connection Status: 753985
Implantable Lead Connection Status: 753985
Implantable Lead Connection Status: 753985
Implantable Lead Implant Date: 20190318
Implantable Lead Implant Date: 20190318
Implantable Lead Implant Date: 20190318
Implantable Lead Location: 753858
Implantable Lead Location: 753859
Implantable Lead Location: 753860
Implantable Lead Model: 293
Implantable Lead Model: 5076
Implantable Lead Serial Number: 442838
Implantable Pulse Generator Implant Date: 20190318
Lead Channel Impedance Value: 1007 Ohm
Lead Channel Impedance Value: 222.34 Ohm
Lead Channel Impedance Value: 237.686
Lead Channel Impedance Value: 253.786
Lead Channel Impedance Value: 273.729
Lead Channel Impedance Value: 297.365
Lead Channel Impedance Value: 342 Ohm
Lead Channel Impedance Value: 342 Ohm
Lead Channel Impedance Value: 399 Ohm
Lead Channel Impedance Value: 418 Ohm
Lead Channel Impedance Value: 475 Ohm
Lead Channel Impedance Value: 551 Ohm
Lead Channel Impedance Value: 646 Ohm
Lead Channel Impedance Value: 779 Ohm
Lead Channel Impedance Value: 836 Ohm
Lead Channel Impedance Value: 874 Ohm
Lead Channel Impedance Value: 931 Ohm
Lead Channel Impedance Value: 988 Ohm
Lead Channel Pacing Threshold Amplitude: 0.5 V
Lead Channel Pacing Threshold Amplitude: 0.875 V
Lead Channel Pacing Threshold Amplitude: 0.875 V
Lead Channel Pacing Threshold Pulse Width: 0.4 ms
Lead Channel Pacing Threshold Pulse Width: 0.4 ms
Lead Channel Pacing Threshold Pulse Width: 1 ms
Lead Channel Sensing Intrinsic Amplitude: 13.375 mV
Lead Channel Sensing Intrinsic Amplitude: 13.375 mV
Lead Channel Sensing Intrinsic Amplitude: 4.375 mV
Lead Channel Sensing Intrinsic Amplitude: 4.375 mV
Lead Channel Setting Pacing Amplitude: 0.5 V
Lead Channel Setting Pacing Amplitude: 1.5 V
Lead Channel Setting Pacing Amplitude: 2 V
Lead Channel Setting Pacing Pulse Width: 0.03 ms
Lead Channel Setting Pacing Pulse Width: 1 ms
Lead Channel Setting Sensing Sensitivity: 0.6 mV
Zone Setting Status: 755011
Zone Setting Status: 755011

## 2023-06-28 NOTE — Progress Notes (Signed)
Remote ICD transmission.   

## 2023-09-15 ENCOUNTER — Ambulatory Visit (INDEPENDENT_AMBULATORY_CARE_PROVIDER_SITE_OTHER): Payer: Commercial Managed Care - PPO

## 2023-09-15 DIAGNOSIS — I428 Other cardiomyopathies: Secondary | ICD-10-CM

## 2023-09-15 LAB — CUP PACEART REMOTE DEVICE CHECK
Battery Remaining Longevity: 24 mo
Battery Voltage: 2.94 V
Brady Statistic AP VP Percent: 0.07 %
Brady Statistic AP VS Percent: 0.01 %
Brady Statistic AS VP Percent: 98.55 %
Brady Statistic AS VS Percent: 1.37 %
Brady Statistic RA Percent Paced: 0.08 %
Brady Statistic RV Percent Paced: 6.2 %
Date Time Interrogation Session: 20241219033425
HighPow Impedance: 67 Ohm
Implantable Lead Connection Status: 753985
Implantable Lead Connection Status: 753985
Implantable Lead Connection Status: 753985
Implantable Lead Implant Date: 20190318
Implantable Lead Implant Date: 20190318
Implantable Lead Implant Date: 20190318
Implantable Lead Location: 753858
Implantable Lead Location: 753859
Implantable Lead Location: 753860
Implantable Lead Model: 293
Implantable Lead Model: 5076
Implantable Lead Serial Number: 442838
Implantable Pulse Generator Implant Date: 20190318
Lead Channel Impedance Value: 1007 Ohm
Lead Channel Impedance Value: 212.8 Ohm
Lead Channel Impedance Value: 228 Ohm
Lead Channel Impedance Value: 240.906
Lead Channel Impedance Value: 260.571
Lead Channel Impedance Value: 283.733
Lead Channel Impedance Value: 285 Ohm
Lead Channel Impedance Value: 304 Ohm
Lead Channel Impedance Value: 361 Ohm
Lead Channel Impedance Value: 399 Ohm
Lead Channel Impedance Value: 456 Ohm
Lead Channel Impedance Value: 532 Ohm
Lead Channel Impedance Value: 608 Ohm
Lead Channel Impedance Value: 703 Ohm
Lead Channel Impedance Value: 779 Ohm
Lead Channel Impedance Value: 817 Ohm
Lead Channel Impedance Value: 950 Ohm
Lead Channel Impedance Value: 988 Ohm
Lead Channel Pacing Threshold Amplitude: 0.5 V
Lead Channel Pacing Threshold Amplitude: 0.75 V
Lead Channel Pacing Threshold Amplitude: 0.875 V
Lead Channel Pacing Threshold Pulse Width: 0.4 ms
Lead Channel Pacing Threshold Pulse Width: 0.4 ms
Lead Channel Pacing Threshold Pulse Width: 1 ms
Lead Channel Sensing Intrinsic Amplitude: 13.5 mV
Lead Channel Sensing Intrinsic Amplitude: 13.5 mV
Lead Channel Sensing Intrinsic Amplitude: 4 mV
Lead Channel Sensing Intrinsic Amplitude: 4 mV
Lead Channel Setting Pacing Amplitude: 0.5 V
Lead Channel Setting Pacing Amplitude: 1.25 V
Lead Channel Setting Pacing Amplitude: 2 V
Lead Channel Setting Pacing Pulse Width: 0.03 ms
Lead Channel Setting Pacing Pulse Width: 1 ms
Lead Channel Setting Sensing Sensitivity: 0.6 mV
Zone Setting Status: 755011
Zone Setting Status: 755011

## 2023-10-19 NOTE — Progress Notes (Signed)
Remote ICD transmission.   

## 2023-12-15 ENCOUNTER — Ambulatory Visit: Payer: BC Managed Care – PPO

## 2023-12-15 DIAGNOSIS — I428 Other cardiomyopathies: Secondary | ICD-10-CM | POA: Diagnosis not present

## 2023-12-15 LAB — CUP PACEART REMOTE DEVICE CHECK
Battery Remaining Longevity: 23 mo
Battery Voltage: 2.93 V
Brady Statistic AP VP Percent: 0.04 %
Brady Statistic AP VS Percent: 0.01 %
Brady Statistic AS VP Percent: 98.55 %
Brady Statistic AS VS Percent: 1.4 %
Brady Statistic RA Percent Paced: 0.05 %
Brady Statistic RV Percent Paced: 5.02 %
Date Time Interrogation Session: 20250320043823
HighPow Impedance: 68 Ohm
Implantable Lead Connection Status: 753985
Implantable Lead Connection Status: 753985
Implantable Lead Connection Status: 753985
Implantable Lead Implant Date: 20190318
Implantable Lead Implant Date: 20190318
Implantable Lead Implant Date: 20190318
Implantable Lead Location: 753858
Implantable Lead Location: 753859
Implantable Lead Location: 753860
Implantable Lead Model: 293
Implantable Lead Model: 5076
Implantable Lead Serial Number: 442838
Implantable Pulse Generator Implant Date: 20190318
Lead Channel Impedance Value: 212.8 Ohm
Lead Channel Impedance Value: 224.438
Lead Channel Impedance Value: 240.906
Lead Channel Impedance Value: 260.571
Lead Channel Impedance Value: 278.237
Lead Channel Impedance Value: 285 Ohm
Lead Channel Impedance Value: 304 Ohm
Lead Channel Impedance Value: 361 Ohm
Lead Channel Impedance Value: 399 Ohm
Lead Channel Impedance Value: 456 Ohm
Lead Channel Impedance Value: 513 Ohm
Lead Channel Impedance Value: 608 Ohm
Lead Channel Impedance Value: 703 Ohm
Lead Channel Impedance Value: 779 Ohm
Lead Channel Impedance Value: 817 Ohm
Lead Channel Impedance Value: 931 Ohm
Lead Channel Impedance Value: 950 Ohm
Lead Channel Impedance Value: 988 Ohm
Lead Channel Pacing Threshold Amplitude: 0.5 V
Lead Channel Pacing Threshold Amplitude: 0.875 V
Lead Channel Pacing Threshold Amplitude: 0.875 V
Lead Channel Pacing Threshold Pulse Width: 0.4 ms
Lead Channel Pacing Threshold Pulse Width: 0.4 ms
Lead Channel Pacing Threshold Pulse Width: 1 ms
Lead Channel Sensing Intrinsic Amplitude: 12.5 mV
Lead Channel Sensing Intrinsic Amplitude: 12.5 mV
Lead Channel Sensing Intrinsic Amplitude: 4.125 mV
Lead Channel Sensing Intrinsic Amplitude: 4.125 mV
Lead Channel Setting Pacing Amplitude: 0.5 V
Lead Channel Setting Pacing Amplitude: 1.5 V
Lead Channel Setting Pacing Amplitude: 2 V
Lead Channel Setting Pacing Pulse Width: 0.03 ms
Lead Channel Setting Pacing Pulse Width: 1 ms
Lead Channel Setting Sensing Sensitivity: 0.6 mV
Zone Setting Status: 755011
Zone Setting Status: 755011

## 2024-01-14 ENCOUNTER — Encounter: Payer: Self-pay | Admitting: Internal Medicine

## 2024-01-26 NOTE — Progress Notes (Signed)
 Remote ICD transmission.

## 2024-03-06 ENCOUNTER — Other Ambulatory Visit: Payer: Self-pay | Admitting: Internal Medicine

## 2024-03-06 DIAGNOSIS — E785 Hyperlipidemia, unspecified: Secondary | ICD-10-CM

## 2024-03-14 ENCOUNTER — Ambulatory Visit
Admission: RE | Admit: 2024-03-14 | Discharge: 2024-03-14 | Disposition: A | Discharge status: Home / Self Care | Source: Ambulatory Visit | Attending: Internal Medicine | Admitting: Internal Medicine

## 2024-03-14 DIAGNOSIS — E785 Hyperlipidemia, unspecified: Secondary | ICD-10-CM

## 2024-03-15 ENCOUNTER — Ambulatory Visit (INDEPENDENT_AMBULATORY_CARE_PROVIDER_SITE_OTHER): Payer: BC Managed Care – PPO

## 2024-03-15 DIAGNOSIS — I428 Other cardiomyopathies: Secondary | ICD-10-CM

## 2024-03-15 LAB — CUP PACEART REMOTE DEVICE CHECK
Battery Remaining Longevity: 21 mo
Battery Voltage: 2.92 V
Brady Statistic AP VP Percent: 0.09 %
Brady Statistic AP VS Percent: 0.01 %
Brady Statistic AS VP Percent: 98.47 %
Brady Statistic AS VS Percent: 1.43 %
Brady Statistic RA Percent Paced: 0.1 %
Brady Statistic RV Percent Paced: 8.12 %
Date Time Interrogation Session: 20250619012205
HighPow Impedance: 72 Ohm
Implantable Lead Connection Status: 753985
Implantable Lead Connection Status: 753985
Implantable Lead Connection Status: 753985
Implantable Lead Implant Date: 20190318
Implantable Lead Implant Date: 20190318
Implantable Lead Implant Date: 20190318
Implantable Lead Location: 753858
Implantable Lead Location: 753859
Implantable Lead Location: 753860
Implantable Lead Model: 293
Implantable Lead Model: 5076
Implantable Lead Serial Number: 442838
Implantable Pulse Generator Implant Date: 20190318
Lead Channel Impedance Value: 1026 Ohm
Lead Channel Impedance Value: 216.848
Lead Channel Impedance Value: 231.42 Ohm
Lead Channel Impedance Value: 240.906
Lead Channel Impedance Value: 266.667
Lead Channel Impedance Value: 289.049
Lead Channel Impedance Value: 304 Ohm
Lead Channel Impedance Value: 304 Ohm
Lead Channel Impedance Value: 361 Ohm
Lead Channel Impedance Value: 399 Ohm
Lead Channel Impedance Value: 475 Ohm
Lead Channel Impedance Value: 551 Ohm
Lead Channel Impedance Value: 608 Ohm
Lead Channel Impedance Value: 722 Ohm
Lead Channel Impedance Value: 817 Ohm
Lead Channel Impedance Value: 836 Ohm
Lead Channel Impedance Value: 931 Ohm
Lead Channel Impedance Value: 988 Ohm
Lead Channel Pacing Threshold Amplitude: 0.5 V
Lead Channel Pacing Threshold Amplitude: 0.625 V
Lead Channel Pacing Threshold Amplitude: 0.875 V
Lead Channel Pacing Threshold Pulse Width: 0.4 ms
Lead Channel Pacing Threshold Pulse Width: 0.4 ms
Lead Channel Pacing Threshold Pulse Width: 1 ms
Lead Channel Sensing Intrinsic Amplitude: 12.875 mV
Lead Channel Sensing Intrinsic Amplitude: 12.875 mV
Lead Channel Sensing Intrinsic Amplitude: 4.5 mV
Lead Channel Sensing Intrinsic Amplitude: 4.5 mV
Lead Channel Setting Pacing Amplitude: 0.5 V
Lead Channel Setting Pacing Amplitude: 1.25 V
Lead Channel Setting Pacing Amplitude: 2 V
Lead Channel Setting Pacing Pulse Width: 0.03 ms
Lead Channel Setting Pacing Pulse Width: 1 ms
Lead Channel Setting Sensing Sensitivity: 0.6 mV
Zone Setting Status: 755011
Zone Setting Status: 755011

## 2024-03-16 ENCOUNTER — Ambulatory Visit: Payer: Self-pay | Admitting: Cardiology

## 2024-05-02 NOTE — Progress Notes (Unsigned)
 Cardiology Office Note:  .   Date:  05/02/2024  ID:  Joseph Parrish, DOB 03/28/1966, MRN [id number] PCP: Loreli Elsie JONETTA Mickey., MD  [facility] Providers Cardiologist:  Toribio Fuel, MD Electrophysiologist:  Elspeth Sage, MD {  History of Present Illness: .   Joseph Parrish is a 58 y.o. male w/PMHx of  HTN, gout NICM, LBBB, chronic CHF (systolic)  Last saw Dr. Fuel Feb 2022, was doing well, LVEF recovered and no longer felt the need to c/w the AHF team  Last saw Dr. Sage 05/09/23, device programmed adaptive CRT (mostly LV only pacing), pt was feeling well, some nighttime palpitations 10% PVC burden   Today's visit is scheduled as an annual device visit ROS:   He feels quite well. No CP, palpitations or cardiac awareness No exertional intolerances, no DOE, SOB Likes to golf, works out fairly heavily at least once weekly, reports very good exertional capacity No near syncope or syncope  Labs done via his PMD Recently did coronary Ca++ score was <2 Lipid profile apparently could be better, they are discussing medical management/options    Device information MDT CRT-D implanted 12/12/2017 Has: MDT RA lead BSci RV lead Abbott LV lead  RV lead is programmed subthreshold   Studies Reviewed: SABRA    EKG done today and reviewed by myself:  SB/VP 59bpm, excellent QRS morphology/duration  DEVICE interrogation done today and reviewed by myself Battery and lead measurements are good No arrhythmias 98% BP  04/11/23: TTE 1. Left ventricular ejection fraction, by estimation, is 60 to 65%. The  left ventricle has normal function. The left ventricle has no regional  wall motion abnormalities. Left ventricular diastolic parameters were  normal.   2. Right ventricular systolic function is normal. The right ventricular  size is normal. There is normal pulmonary artery systolic pressure.   3. The mitral valve is normal in structure. Trivial mitral valve   regurgitation. No evidence of mitral stenosis.   4. The aortic valve is normal in structure. Aortic valve regurgitation is  not visualized. No aortic stenosis is present.   5. The inferior vena cava is dilated in size with >50% respiratory  variability, suggesting right atrial pressure of 8 mmHg.   6. There is a pacing lead in the coronary sinus.   Echo 3/20: EF 50-55% Echo 4/21: EF 55-66% cMRI 11/18 EF 26% no LGE  cath in 7/18 which showed normal coronary arteries. EF 40%  June 2018 with EF 25%.   Risk Assessment/Calculations:    Physical Exam:   VS:  There were no vitals taken for this visit.   Wt Readings from Last 3 Encounters:  05/09/23 221 lb 9.6 oz (100.5 kg)  08/31/22 219 lb (99.3 kg)  12/15/20 217 lb (98.4 kg)    GEN: Well nourished, well developed in no acute distress NECK: No JVD; No carotid bruits CARDIAC: RRR, no murmurs, rubs, gallops RESPIRATORY:  CTA b/l without rales, wheezing or rhonchi  ABDOMEN: Soft, non-tender, non-distended EXTREMITIES: No edema; No deformity   ICD site: is stable, no thinning, fluctuation, tethering  ASSESSMENT AND PLAN: .    CRT-D intact function no programming changes made  NICM LBBB Chronic CHF (systolic) Recovered LVEF by his most recent echo 2024 Clinically no changes No symptoms or exam findings of volume OL  OptiVol looks good  He inquires about getting an echo, reasonable to do, if LVEF remains preserved, could do PRN/biannually perhaps  Dispo: remotes as usual, in clinic in 1 year,  sooner if needed  Signed, Charlies Macario Arthur, PA-C

## 2024-05-03 ENCOUNTER — Encounter: Payer: Self-pay | Admitting: Physician Assistant

## 2024-05-03 ENCOUNTER — Ambulatory Visit: Payer: Self-pay | Attending: Physician Assistant | Admitting: Physician Assistant

## 2024-05-03 DIAGNOSIS — I428 Other cardiomyopathies: Secondary | ICD-10-CM | POA: Diagnosis not present

## 2024-05-03 DIAGNOSIS — Z95810 Presence of automatic (implantable) cardiac defibrillator: Secondary | ICD-10-CM

## 2024-05-03 LAB — CUP PACEART INCLINIC DEVICE CHECK
Battery Remaining Longevity: 20 mo
Battery Voltage: 2.92 V
Brady Statistic AP VP Percent: 0.11 %
Brady Statistic AP VS Percent: 0.01 %
Brady Statistic AS VP Percent: 98.48 %
Brady Statistic AS VS Percent: 1.4 %
Brady Statistic RA Percent Paced: 0.12 %
Brady Statistic RV Percent Paced: 6.16 %
Date Time Interrogation Session: 20250807121334
HighPow Impedance: 77 Ohm
Implantable Lead Connection Status: 753985
Implantable Lead Connection Status: 753985
Implantable Lead Connection Status: 753985
Implantable Lead Implant Date: 20190318
Implantable Lead Implant Date: 20190318
Implantable Lead Implant Date: 20190318
Implantable Lead Location: 753858
Implantable Lead Location: 753859
Implantable Lead Location: 753860
Implantable Lead Model: 293
Implantable Lead Model: 5076
Implantable Lead Serial Number: 442838
Implantable Pulse Generator Implant Date: 20190318
Lead Channel Impedance Value: 1026 Ohm
Lead Channel Impedance Value: 216.848
Lead Channel Impedance Value: 231.42 Ohm
Lead Channel Impedance Value: 240.906
Lead Channel Impedance Value: 266.667
Lead Channel Impedance Value: 289.049
Lead Channel Impedance Value: 342 Ohm
Lead Channel Impedance Value: 342 Ohm
Lead Channel Impedance Value: 399 Ohm
Lead Channel Impedance Value: 399 Ohm
Lead Channel Impedance Value: 475 Ohm
Lead Channel Impedance Value: 551 Ohm
Lead Channel Impedance Value: 608 Ohm
Lead Channel Impedance Value: 760 Ohm
Lead Channel Impedance Value: 874 Ohm
Lead Channel Impedance Value: 893 Ohm
Lead Channel Impedance Value: 931 Ohm
Lead Channel Impedance Value: 931 Ohm
Lead Channel Pacing Threshold Amplitude: 0.5 V
Lead Channel Pacing Threshold Amplitude: 0.75 V
Lead Channel Pacing Threshold Amplitude: 0.875 V
Lead Channel Pacing Threshold Pulse Width: 0.4 ms
Lead Channel Pacing Threshold Pulse Width: 0.4 ms
Lead Channel Pacing Threshold Pulse Width: 1 ms
Lead Channel Sensing Intrinsic Amplitude: 13.375 mV
Lead Channel Sensing Intrinsic Amplitude: 13.5 mV
Lead Channel Sensing Intrinsic Amplitude: 4.125 mV
Lead Channel Sensing Intrinsic Amplitude: 5 mV
Lead Channel Setting Pacing Amplitude: 0.5 V
Lead Channel Setting Pacing Amplitude: 1.25 V
Lead Channel Setting Pacing Amplitude: 2 V
Lead Channel Setting Pacing Pulse Width: 0.03 ms
Lead Channel Setting Pacing Pulse Width: 1 ms
Lead Channel Setting Sensing Sensitivity: 0.6 mV
Zone Setting Status: 755011
Zone Setting Status: 755011

## 2024-05-03 NOTE — Patient Instructions (Signed)
 Medication Instructions:   Your physician recommends that you continue on your current medications as directed. Please refer to the Current Medication list given to you today.  *If you need a refill on your cardiac medications before your next appointment, please call your pharmacy*   Lab Work: NONE ORDERED  TODAY     If you have labs (blood work) drawn today and your tests are completely normal, you will receive your results only by: MyChart Message (if you have MyChart) OR A paper copy in the mail If you have any lab test that is abnormal or we need to change your treatment, we will call you to review the results.   Testing/Procedures: NONE ORDERED  TODAY      Follow-Up:  At [facility], you and your health needs are our priority.  As part of our continuing mission to provide you with exceptional heart care, our providers are all part of one team.  This team includes your primary Cardiologist (physician) and Advanced Practice Providers or APPs (Physician Assistants and Nurse Practitioners) who all work together to provide you with the care you need, when you need it.   Your next appointment:     1 year(s)   Provider:    Ole Holts, MD     We recommend signing up for the patient portal called MyChart.  Sign up information is provided on this After Visit Summary.  MyChart is used to connect with patients for Virtual Visits (Telemedicine).  Patients are able to view lab/test results, encounter notes, upcoming appointments, etc.  Non-urgent messages can be sent to your provider as well.   To learn more about what you can do with MyChart, go to [email or web address].   Other Instructions

## 2024-05-04 ENCOUNTER — Ambulatory Visit: Payer: Self-pay | Admitting: Cardiology

## 2024-05-22 NOTE — Progress Notes (Signed)
 Remote ICD transmission.

## 2024-06-08 ENCOUNTER — Ambulatory Visit (HOSPITAL_COMMUNITY)
Admission: RE | Admit: 2024-06-08 | Discharge: 2024-06-08 | Disposition: A | Discharge status: Home / Self Care | Source: Ambulatory Visit | Attending: Physician Assistant | Admitting: Physician Assistant

## 2024-06-08 DIAGNOSIS — I428 Other cardiomyopathies: Secondary | ICD-10-CM | POA: Diagnosis present

## 2024-06-08 DIAGNOSIS — I429 Cardiomyopathy, unspecified: Secondary | ICD-10-CM

## 2024-06-08 LAB — ECHOCARDIOGRAM COMPLETE
Area-P 1/2: 3.53 cm2
S' Lateral: 2.7 cm

## 2024-06-14 ENCOUNTER — Ambulatory Visit (INDEPENDENT_AMBULATORY_CARE_PROVIDER_SITE_OTHER): Payer: BC Managed Care – PPO

## 2024-06-14 ENCOUNTER — Ambulatory Visit: Payer: Self-pay | Admitting: Cardiology

## 2024-06-14 DIAGNOSIS — I428 Other cardiomyopathies: Secondary | ICD-10-CM

## 2024-06-14 LAB — CUP PACEART REMOTE DEVICE CHECK
Battery Remaining Longevity: 21 mo
Battery Voltage: 2.92 V
Brady Statistic AP VP Percent: 0.22 %
Brady Statistic AP VS Percent: 0.01 %
Brady Statistic AS VP Percent: 98.32 %
Brady Statistic AS VS Percent: 1.45 %
Brady Statistic RA Percent Paced: 0.24 %
Brady Statistic RV Percent Paced: 4.03 %
Date Time Interrogation Session: 20250918033624
HighPow Impedance: 72 Ohm
Implantable Lead Connection Status: 753985
Implantable Lead Connection Status: 753985
Implantable Lead Connection Status: 753985
Implantable Lead Implant Date: 20190318
Implantable Lead Implant Date: 20190318
Implantable Lead Implant Date: 20190318
Implantable Lead Location: 753858
Implantable Lead Location: 753859
Implantable Lead Location: 753860
Implantable Lead Model: 293
Implantable Lead Model: 5076
Implantable Lead Serial Number: 442838
Implantable Pulse Generator Implant Date: 20190318
Lead Channel Impedance Value: 1026 Ohm
Lead Channel Impedance Value: 216.848
Lead Channel Impedance Value: 231.42 Ohm
Lead Channel Impedance Value: 246.655
Lead Channel Impedance Value: 273.729
Lead Channel Impedance Value: 297.365
Lead Channel Impedance Value: 304 Ohm
Lead Channel Impedance Value: 304 Ohm
Lead Channel Impedance Value: 361 Ohm
Lead Channel Impedance Value: 399 Ohm
Lead Channel Impedance Value: 475 Ohm
Lead Channel Impedance Value: 551 Ohm
Lead Channel Impedance Value: 646 Ohm
Lead Channel Impedance Value: 703 Ohm
Lead Channel Impedance Value: 836 Ohm
Lead Channel Impedance Value: 874 Ohm
Lead Channel Impedance Value: 931 Ohm
Lead Channel Impedance Value: 988 Ohm
Lead Channel Pacing Threshold Amplitude: 0.5 V
Lead Channel Pacing Threshold Amplitude: 0.625 V
Lead Channel Pacing Threshold Amplitude: 0.875 V
Lead Channel Pacing Threshold Pulse Width: 0.4 ms
Lead Channel Pacing Threshold Pulse Width: 0.4 ms
Lead Channel Pacing Threshold Pulse Width: 1 ms
Lead Channel Sensing Intrinsic Amplitude: 11.5 mV
Lead Channel Sensing Intrinsic Amplitude: 11.5 mV
Lead Channel Sensing Intrinsic Amplitude: 3.875 mV
Lead Channel Sensing Intrinsic Amplitude: 3.875 mV
Lead Channel Setting Pacing Amplitude: 0.5 V
Lead Channel Setting Pacing Amplitude: 1.25 V
Lead Channel Setting Pacing Amplitude: 2 V
Lead Channel Setting Pacing Pulse Width: 0.03 ms
Lead Channel Setting Pacing Pulse Width: 1 ms
Lead Channel Setting Sensing Sensitivity: 0.6 mV
Zone Setting Status: 755011
Zone Setting Status: 755011

## 2024-06-19 NOTE — Progress Notes (Signed)
Remote ICD Transmission.

## 2024-09-13 ENCOUNTER — Ambulatory Visit

## 2024-09-13 DIAGNOSIS — I428 Other cardiomyopathies: Secondary | ICD-10-CM

## 2024-09-14 LAB — CUP PACEART REMOTE DEVICE CHECK
Battery Remaining Longevity: 20 mo
Battery Voltage: 2.91 V
Brady Statistic AP VP Percent: 0.12 %
Brady Statistic AP VS Percent: 0.01 %
Brady Statistic AS VP Percent: 98.4 %
Brady Statistic AS VS Percent: 1.47 %
Brady Statistic RA Percent Paced: 0.13 %
Brady Statistic RV Percent Paced: 5.79 %
Date Time Interrogation Session: 20251218033624
HighPow Impedance: 72 Ohm
Implantable Lead Connection Status: 753985
Implantable Lead Connection Status: 753985
Implantable Lead Connection Status: 753985
Implantable Lead Implant Date: 20190318
Implantable Lead Implant Date: 20190318
Implantable Lead Implant Date: 20190318
Implantable Lead Location: 753858
Implantable Lead Location: 753859
Implantable Lead Location: 753860
Implantable Lead Model: 293
Implantable Lead Model: 5076
Implantable Lead Serial Number: 442838
Implantable Pulse Generator Implant Date: 20190318
Lead Channel Impedance Value: 1007 Ohm
Lead Channel Impedance Value: 204.14 Ohm
Lead Channel Impedance Value: 228 Ohm
Lead Channel Impedance Value: 246.655
Lead Channel Impedance Value: 253.786
Lead Channel Impedance Value: 291.742
Lead Channel Impedance Value: 304 Ohm
Lead Channel Impedance Value: 304 Ohm
Lead Channel Impedance Value: 361 Ohm
Lead Channel Impedance Value: 399 Ohm
Lead Channel Impedance Value: 418 Ohm
Lead Channel Impedance Value: 532 Ohm
Lead Channel Impedance Value: 646 Ohm
Lead Channel Impedance Value: 703 Ohm
Lead Channel Impedance Value: 779 Ohm
Lead Channel Impedance Value: 817 Ohm
Lead Channel Impedance Value: 931 Ohm
Lead Channel Impedance Value: 988 Ohm
Lead Channel Pacing Threshold Amplitude: 0.5 V
Lead Channel Pacing Threshold Amplitude: 0.625 V
Lead Channel Pacing Threshold Amplitude: 0.875 V
Lead Channel Pacing Threshold Pulse Width: 0.4 ms
Lead Channel Pacing Threshold Pulse Width: 0.4 ms
Lead Channel Pacing Threshold Pulse Width: 1 ms
Lead Channel Sensing Intrinsic Amplitude: 15.375 mV
Lead Channel Sensing Intrinsic Amplitude: 15.375 mV
Lead Channel Sensing Intrinsic Amplitude: 4.625 mV
Lead Channel Sensing Intrinsic Amplitude: 4.625 mV
Lead Channel Setting Pacing Amplitude: 0.5 V
Lead Channel Setting Pacing Amplitude: 1.25 V
Lead Channel Setting Pacing Amplitude: 2 V
Lead Channel Setting Pacing Pulse Width: 0.03 ms
Lead Channel Setting Pacing Pulse Width: 1 ms
Lead Channel Setting Sensing Sensitivity: 0.6 mV
Zone Setting Status: 755011
Zone Setting Status: 755011

## 2024-09-14 NOTE — Progress Notes (Signed)
 Remote ICD Transmission

## 2024-09-29 ENCOUNTER — Ambulatory Visit: Payer: Self-pay | Admitting: Cardiology

## 2024-12-13 ENCOUNTER — Encounter

## 2025-03-14 ENCOUNTER — Encounter

## 2025-06-13 ENCOUNTER — Encounter

## 2025-09-12 ENCOUNTER — Encounter

## 2025-12-12 ENCOUNTER — Encounter
